# Patient Record
Sex: Female | Born: 1975 | Race: Black or African American | Hispanic: No | Marital: Married | Smoking: Never smoker
Health system: Southern US, Community
[De-identification: ages and names within clinical notes are randomized; demographics above are authoritative.]

## PROBLEM LIST (undated history)

## (undated) ENCOUNTER — Inpatient Hospital Stay (HOSPITAL_COMMUNITY): Payer: Self-pay

## (undated) DIAGNOSIS — L509 Urticaria, unspecified: Secondary | ICD-10-CM

## (undated) DIAGNOSIS — E669 Obesity, unspecified: Secondary | ICD-10-CM

## (undated) DIAGNOSIS — J069 Acute upper respiratory infection, unspecified: Secondary | ICD-10-CM

## (undated) DIAGNOSIS — I119 Hypertensive heart disease without heart failure: Secondary | ICD-10-CM

## (undated) DIAGNOSIS — J301 Allergic rhinitis due to pollen: Secondary | ICD-10-CM

## (undated) DIAGNOSIS — K219 Gastro-esophageal reflux disease without esophagitis: Secondary | ICD-10-CM

## (undated) DIAGNOSIS — I10 Essential (primary) hypertension: Secondary | ICD-10-CM

## (undated) DIAGNOSIS — D709 Neutropenia, unspecified: Secondary | ICD-10-CM

## (undated) HISTORY — DX: Hypertensive heart disease without heart failure: I11.9

## (undated) HISTORY — DX: Obesity, unspecified: E66.9

## (undated) HISTORY — DX: Allergic rhinitis due to pollen: J30.1

## (undated) HISTORY — DX: Urticaria, unspecified: L50.9

## (undated) HISTORY — PX: WISDOM TOOTH EXTRACTION: SHX21

## (undated) HISTORY — DX: Acute upper respiratory infection, unspecified: J06.9

## (undated) HISTORY — DX: Neutropenia, unspecified: D70.9

---

## 2005-09-08 ENCOUNTER — Other Ambulatory Visit: Admission: RE | Admit: 2005-09-08 | Discharge: 2005-09-08 | Payer: Self-pay | Admitting: Obstetrics and Gynecology

## 2006-11-27 ENCOUNTER — Emergency Department (HOSPITAL_COMMUNITY): Admission: EM | Admit: 2006-11-27 | Discharge: 2006-11-27 | Payer: Self-pay | Admitting: Emergency Medicine

## 2007-01-21 ENCOUNTER — Emergency Department (HOSPITAL_COMMUNITY): Admission: EM | Admit: 2007-01-21 | Discharge: 2007-01-21 | Payer: Self-pay | Admitting: Family Medicine

## 2008-01-12 ENCOUNTER — Emergency Department (HOSPITAL_COMMUNITY): Admission: EM | Admit: 2008-01-12 | Discharge: 2008-01-12 | Payer: Self-pay | Admitting: Family Medicine

## 2010-01-23 ENCOUNTER — Encounter: Admission: RE | Admit: 2010-01-23 | Discharge: 2010-01-23 | Payer: Self-pay | Admitting: Internal Medicine

## 2011-02-12 ENCOUNTER — Emergency Department (HOSPITAL_COMMUNITY)
Admission: EM | Admit: 2011-02-12 | Discharge: 2011-02-12 | Disposition: A | Discharge status: Home / Self Care | Payer: 59 | Attending: Emergency Medicine | Admitting: Emergency Medicine

## 2011-02-12 DIAGNOSIS — R1013 Epigastric pain: Secondary | ICD-10-CM | POA: Insufficient documentation

## 2011-02-12 DIAGNOSIS — I10 Essential (primary) hypertension: Secondary | ICD-10-CM | POA: Insufficient documentation

## 2011-02-12 LAB — URINALYSIS, ROUTINE W REFLEX MICROSCOPIC
Bilirubin Urine: NEGATIVE
Glucose, UA: NEGATIVE mg/dL
Hgb urine dipstick: NEGATIVE
Ketones, ur: NEGATIVE mg/dL
Nitrite: NEGATIVE
Protein, ur: NEGATIVE mg/dL
Specific Gravity, Urine: 1.011 (ref 1.005–1.030)
Urobilinogen, UA: 0.2 mg/dL (ref 0.0–1.0)
pH: 7 (ref 5.0–8.0)

## 2011-02-12 LAB — CBC
HCT: 41.3 % (ref 36.0–46.0)
Hemoglobin: 13.4 g/dL (ref 12.0–15.0)
MCHC: 32.4 g/dL (ref 30.0–36.0)
MCV: 78.4 fL (ref 78.0–100.0)
Platelets: 434 10*3/uL — ABNORMAL HIGH (ref 150–400)
RDW: 13 % (ref 11.5–15.5)

## 2011-02-12 LAB — DIFFERENTIAL
Basophils Relative: 0 % (ref 0–1)
Eosinophils Relative: 1 % (ref 0–5)
Neutrophils Relative %: 55 % (ref 43–77)

## 2011-02-12 LAB — COMPREHENSIVE METABOLIC PANEL
ALT: 15 U/L (ref 0–35)
AST: 20 U/L (ref 0–37)
Albumin: 4 g/dL (ref 3.5–5.2)
BUN: 7 mg/dL (ref 6–23)
Calcium: 9.2 mg/dL (ref 8.4–10.5)
Creatinine, Ser: 0.85 mg/dL (ref 0.4–1.2)
Total Protein: 7.6 g/dL (ref 6.0–8.3)

## 2011-02-12 LAB — PREGNANCY, URINE: Preg Test, Ur: NEGATIVE

## 2011-03-10 LAB — HEMOGLOBIN A1C: Hgb A1c MFr Bld: 6.1 % — AB (ref 4.0–6.0)

## 2011-03-10 LAB — LIPID PANEL
HDL: 47 mg/dL (ref 35–70)
LDL Cholesterol: 113 mg/dL
LDl/HDL Ratio: 3.8

## 2011-03-13 ENCOUNTER — Encounter: Payer: Self-pay | Admitting: Internal Medicine

## 2011-04-30 ENCOUNTER — Ambulatory Visit (HOSPITAL_COMMUNITY): Payer: 59

## 2011-05-01 ENCOUNTER — Ambulatory Visit (HOSPITAL_COMMUNITY): Payer: 59 | Attending: Internal Medicine

## 2011-06-17 ENCOUNTER — Ambulatory Visit (HOSPITAL_COMMUNITY)
Admission: RE | Admit: 2011-06-17 | Discharge: 2011-06-17 | Disposition: A | Discharge status: Home / Self Care | Payer: 59 | Source: Ambulatory Visit | Attending: Internal Medicine | Admitting: Internal Medicine

## 2011-06-17 DIAGNOSIS — I10 Essential (primary) hypertension: Secondary | ICD-10-CM | POA: Insufficient documentation

## 2011-06-17 DIAGNOSIS — I739 Peripheral vascular disease, unspecified: Secondary | ICD-10-CM

## 2011-06-17 DIAGNOSIS — R209 Unspecified disturbances of skin sensation: Secondary | ICD-10-CM | POA: Insufficient documentation

## 2011-08-26 ENCOUNTER — Ambulatory Visit (HOSPITAL_COMMUNITY)
Admission: RE | Admit: 2011-08-26 | Discharge: 2011-08-26 | Disposition: A | Discharge status: Home / Self Care | Payer: 59 | Source: Ambulatory Visit | Attending: Obstetrics and Gynecology | Admitting: Obstetrics and Gynecology

## 2011-08-26 LAB — ABO/RH: ABO/RH(D): O POS

## 2011-09-16 LAB — OB RESULTS CONSOLE RPR: RPR: NONREACTIVE

## 2011-09-16 LAB — OB RESULTS CONSOLE HIV ANTIBODY (ROUTINE TESTING): HIV: NONREACTIVE

## 2011-09-16 LAB — OB RESULTS CONSOLE HEPATITIS B SURFACE ANTIGEN: Hepatitis B Surface Ag: NEGATIVE

## 2011-09-16 LAB — OB RESULTS CONSOLE ANTIBODY SCREEN: Antibody Screen: NEGATIVE

## 2012-02-25 ENCOUNTER — Inpatient Hospital Stay (HOSPITAL_COMMUNITY)
Admission: AD | Admit: 2012-02-25 | Discharge: 2012-02-25 | Disposition: A | Discharge status: Home / Self Care | Payer: 59 | Source: Ambulatory Visit | Attending: Obstetrics and Gynecology | Admitting: Obstetrics and Gynecology

## 2012-02-25 ENCOUNTER — Encounter (HOSPITAL_COMMUNITY): Payer: Self-pay | Admitting: *Deleted

## 2012-02-25 DIAGNOSIS — O212 Late vomiting of pregnancy: Secondary | ICD-10-CM | POA: Insufficient documentation

## 2012-02-25 DIAGNOSIS — K529 Noninfective gastroenteritis and colitis, unspecified: Secondary | ICD-10-CM

## 2012-02-25 DIAGNOSIS — K5289 Other specified noninfective gastroenteritis and colitis: Secondary | ICD-10-CM | POA: Insufficient documentation

## 2012-02-25 DIAGNOSIS — O99891 Other specified diseases and conditions complicating pregnancy: Secondary | ICD-10-CM | POA: Insufficient documentation

## 2012-02-25 HISTORY — DX: Essential (primary) hypertension: I10

## 2012-02-25 LAB — URINALYSIS, ROUTINE W REFLEX MICROSCOPIC
Glucose, UA: NEGATIVE mg/dL
Hgb urine dipstick: NEGATIVE
Ketones, ur: NEGATIVE mg/dL
Nitrite: NEGATIVE
Specific Gravity, Urine: >1.03 — ABNORMAL HIGH (ref 1.005–1.030)
Urobilinogen, UA: 0.2 mg/dL (ref 0.0–1.0)

## 2012-02-25 LAB — URINE MICROSCOPIC-ADD ON

## 2012-02-25 MED ORDER — PROMETHAZINE HCL 25 MG PO TABS: 25.0000 mg | ORAL_TABLET | Freq: Four times a day (QID) | ORAL | Status: AC | PRN

## 2012-02-25 NOTE — MAU Note (Signed)
Pt states, " I stated having diarrhea at 3 pm, and then shortly afterwards I started vomiting. I 've thrown up once and had diarrhea 3 times."

## 2012-02-25 NOTE — Discharge Instructions (Signed)
Viral Gastroenteritis Viral gastroenteritis is also known as stomach flu. This condition affects the stomach and intestinal tract. It can cause sudden diarrhea and vomiting. The illness typically lasts 3 to 8 days. Most people develop an immune response that eventually gets rid of the virus. While this natural response develops, the virus can make you quite ill. CAUSES  Many different viruses can cause gastroenteritis, such as rotavirus or noroviruses. You can catch one of these viruses by consuming contaminated food or water. You may also catch a virus by sharing utensils or other personal items with an infected person or by touching a contaminated surface. SYMPTOMS  The most common symptoms are diarrhea and vomiting. These problems can cause a severe loss of body fluids (dehydration) and a body salt (electrolyte) imbalance. Other symptoms may include:  Fever.   Headache.   Fatigue.   Abdominal pain.  DIAGNOSIS  Your caregiver can usually diagnose viral gastroenteritis based on your symptoms and a physical exam. A stool sample may also be taken to test for the presence of viruses or other infections. TREATMENT  This illness typically goes away on its own. Treatments are aimed at rehydration. The most serious cases of viral gastroenteritis involve vomiting so severely that you are not able to keep fluids down. In these cases, fluids must be given through an intravenous line (IV). HOME CARE INSTRUCTIONS   Drink enough fluids to keep your urine clear or pale yellow. Drink small amounts of fluids frequently and increase the amounts as tolerated.   Ask your caregiver for specific rehydration instructions.   Avoid:   Foods high in sugar.   Alcohol.   Carbonated drinks.   Tobacco.   Juice.   Caffeine drinks.   Extremely hot or cold fluids.   Fatty, greasy foods.   Too much intake of anything at one time.   Dairy products until 24 to 48 hours after diarrhea stops.   You may  consume probiotics. Probiotics are active cultures of beneficial bacteria. They may lessen the amount and number of diarrheal stools in adults. Probiotics can be found in yogurt with active cultures and in supplements.   Wash your hands well to avoid spreading the virus.   Only take over-the-counter or prescription medicines for pain, discomfort, or fever as directed by your caregiver. Do not give aspirin to children. Antidiarrheal medicines are not recommended.   Ask your caregiver if you should continue to take your regular prescribed and over-the-counter medicines.   Keep all follow-up appointments as directed by your caregiver.  SEEK IMMEDIATE MEDICAL CARE IF:   You are unable to keep fluids down.   You do not urinate at least once every 6 to 8 hours.   You develop shortness of breath.   You notice blood in your stool or vomit. This may look like coffee grounds.   You have abdominal pain that increases or is concentrated in one small area (localized).   You have persistent vomiting or diarrhea.   You have a fever.   The patient is a child younger than 3 months, and he or she has a fever.   The patient is a child older than 3 months, and he or she has a fever and persistent symptoms.   The patient is a child older than 3 months, and he or she has a fever and symptoms suddenly get worse.   The patient is a baby, and he or she has no tears when crying.  MAKE SURE YOU:     Understand these instructions.   Will watch your condition.   Will get help right away if you are not doing well or get worse.  Document Released: 11/17/2005 Document Revised: 11/06/2011 Document Reviewed: 09/03/2011 [employer] Patient Information 2012 [employer]. 

## 2012-02-25 NOTE — MAU Provider Note (Signed)
History   Pt presents today c/o N&V x 2 episodes. She states her sx came on suddenly and she attributes her sx to something she ate. She states her nausea and vomiting has now resolved although she did have 2 episodes of diarrhea since being in the MAU. She reports GFM and denies vag dc, bleeding, fever, or any other sx.   CSN: [id number]  Arrival date and time: 02/25/12 4132   First Provider Initiated Contact with Patient 02/25/12 2029      Chief Complaint  Patient presents with  . Emesis  . Diarrhea  . Abdominal Pain   HPI  OB History    Grav Para Term Preterm Abortions TAB SAB Ect Mult Living   2 1  1      1       Past Medical History  Diagnosis Date  . 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 Allergic rhinitis due to pollen   . Neutropenia, unspecified   . Obesity, unspecified   . Hypertension     History reviewed. No pertinent past surgical history.  Family History  Problem Relation Age of Onset  . Hypertension Mother   . Hypertension Father   . Heart disease Father     History  Substance Use Topics  . Smoking status: Never Smoker   . Smokeless tobacco: Never Used  . Alcohol Use: No    Allergies: No Known Allergies  Prescriptions prior to admission  Medication Sig Dispense Refill  . labetalol (NORMODYNE) 300 MG tablet Take 300 mg by mouth 2 (two) times daily.      . methyldopa (ALDOMET) 250 MG tablet Take 250 mg by mouth 2 (two) times daily.      . Prenatal Vit-Fe Fumarate-FA (PRENATAL MULTIVITAMIN) TABS Take 1 tablet by mouth daily.        Review of Systems  Constitutional: Negative for fever and chills.  Eyes: Negative for blurred vision and double vision.  Respiratory: Negative for cough, hemoptysis, sputum production, shortness of breath and wheezing.   Cardiovascular: Negative for chest pain and palpitations.  Gastrointestinal: Positive for nausea, vomiting and diarrhea. Negative for abdominal pain and constipation.    Genitourinary: Negative for dysuria, urgency, frequency and hematuria.  Neurological: Negative for dizziness and headaches.  Psychiatric/Behavioral: Negative for depression and suicidal ideas.   Physical Exam   Blood pressure 148/94, pulse 103, temperature 97.7 F (36.5 C), temperature source Oral, resp. rate 20, height 5' 4.5" (1.638 m), weight 233 lb 6 oz (105.858 kg).  Physical Exam  Nursing note and vitals reviewed. Constitutional: She is oriented to person, place, and time. She appears well-developed and well-nourished. No distress.  HENT:  Head: Normocephalic and atraumatic.  Eyes: EOM are normal. Pupils are equal, round, and reactive to light.  GI: Soft. She exhibits no distension. There is no tenderness. There is no rebound and no guarding.  Neurological: She is alert and oriented to person, place, and time.  Skin: Skin is warm and dry. She is not diaphoretic.  Psychiatric: She has a normal mood and affect. Her behavior is normal. Judgment and thought content normal.    MAU Course  Procedures  Discussed pt with Dr. Vincente Poli. She states her BP is improved since she recently adjusted her BP medication. She thinks it is ok to dc pt to home with antiemetics and precautions.  Assessment and Plan  Gastroenteritis: discussed with pt at length. Will dc to home with phenergan. She has a f/u appt scheduled. She  may use Imodium for diarrhea. Discussed diet, activity, risks, and precautions.  Clinton Gallant. Aishi Courts III, DrHSc, MPAS, PA-C  02/25/2012, 8:35 PM

## 2012-02-25 NOTE — MAU Note (Signed)
Pt states the nausea has passed now but has had 2 loose stools since she got here-states she thinks it was something she ate-note pt has a hx of chronic HTN

## 2012-03-25 ENCOUNTER — Encounter (HOSPITAL_COMMUNITY): Payer: Self-pay | Admitting: Obstetrics

## 2012-03-25 ENCOUNTER — Inpatient Hospital Stay (HOSPITAL_COMMUNITY)
Admission: AD | Admit: 2012-03-25 | Discharge: 2012-03-29 | DRG: 766 | Disposition: A | Discharge status: Home / Self Care | Payer: 59 | Source: Ambulatory Visit | Attending: Obstetrics and Gynecology | Admitting: Obstetrics and Gynecology

## 2012-03-25 DIAGNOSIS — Z98891 History of uterine scar from previous surgery: Secondary | ICD-10-CM

## 2012-03-25 DIAGNOSIS — IMO0002 Reserved for concepts with insufficient information to code with codable children: Principal | ICD-10-CM | POA: Diagnosis present

## 2012-03-25 DIAGNOSIS — I10 Essential (primary) hypertension: Secondary | ICD-10-CM | POA: Diagnosis present

## 2012-03-25 LAB — COMPREHENSIVE METABOLIC PANEL
ALT: 12 U/L (ref 0–35)
AST: 18 U/L (ref 0–37)
Alkaline Phosphatase: 115 U/L (ref 39–117)
CO2: 19 mEq/L (ref 19–32)
Calcium: 9.3 mg/dL (ref 8.4–10.5)
GFR calc non Af Amer: >90 mL/min (ref >90)
Potassium: 4.1 mEq/L (ref 3.5–5.1)
Sodium: 134 mEq/L — ABNORMAL LOW (ref 135–145)

## 2012-03-25 LAB — CBC
MCH: 24.8 pg — ABNORMAL LOW (ref 26.0–34.0)
Platelets: 324 10*3/uL (ref 150–400)
RBC: 4.32 MIL/uL (ref 3.87–5.11)
WBC: 7.9 10*3/uL (ref 4.0–10.5)

## 2012-03-25 MED ORDER — DIPHENHYDRAMINE HCL 50 MG/ML IJ SOLN: 12.5000 mg | INTRAMUSCULAR | Status: DC | PRN

## 2012-03-25 MED ORDER — LACTATED RINGERS IV SOLN
INTRAVENOUS | Status: DC
  Administered 2012-03-25: 20:00:00 via INTRAVENOUS

## 2012-03-25 MED ORDER — CITRIC ACID-SODIUM CITRATE 334-500 MG/5ML PO SOLN
30.0000 mL | ORAL | Status: DC | PRN
  Administered 2012-03-26: 30 mL via ORAL
  Filled 2012-03-25 (×2): qty 15

## 2012-03-25 MED ORDER — LACTATED RINGERS IV SOLN: 500.0000 mL | Freq: Once | INTRAVENOUS | Status: DC

## 2012-03-25 MED ORDER — OXYCODONE-ACETAMINOPHEN 5-325 MG PO TABS: 1.0000 | ORAL_TABLET | ORAL | Status: DC | PRN

## 2012-03-25 MED ORDER — PHENYLEPHRINE 40 MCG/ML (10ML) SYRINGE FOR IV PUSH (FOR BLOOD PRESSURE SUPPORT): 80.0000 ug | PREFILLED_SYRINGE | INTRAVENOUS | Status: DC | PRN

## 2012-03-25 MED ORDER — LACTATED RINGERS IV SOLN: 500.0000 mL | INTRAVENOUS | Status: DC | PRN

## 2012-03-25 MED ORDER — BUTORPHANOL TARTRATE 2 MG/ML IJ SOLN: 1.0000 mg | INTRAMUSCULAR | Status: DC | PRN

## 2012-03-25 MED ORDER — OXYTOCIN 20 UNITS IN LACTATED RINGERS INFUSION - SIMPLE: 125.0000 mL/h | Freq: Once | INTRAVENOUS | Status: DC

## 2012-03-25 MED ORDER — OXYTOCIN BOLUS FROM INFUSION
500.0000 mL | Freq: Once | INTRAVENOUS | Status: DC
  Filled 2012-03-25: qty 500

## 2012-03-25 MED ORDER — MAGNESIUM SULFATE BOLUS VIA INFUSION
4.0000 g | Freq: Once | INTRAVENOUS | Status: AC
  Administered 2012-03-25: 4 g via INTRAVENOUS
  Filled 2012-03-25: qty 500

## 2012-03-25 MED ORDER — EPHEDRINE 5 MG/ML INJ: 10.0000 mg | INTRAVENOUS | Status: DC | PRN

## 2012-03-25 MED ORDER — FENTANYL 2.5 MCG/ML BUPIVACAINE 1/10 % EPIDURAL INFUSION (WH - ANES): 14.0000 mL/h | INTRAMUSCULAR | Status: DC

## 2012-03-25 MED ORDER — LABETALOL HCL 5 MG/ML IV SOLN
20.0000 mg | Freq: Once | INTRAVENOUS | Status: AC
  Administered 2012-03-25: 20 mg via INTRAVENOUS
  Filled 2012-03-25: qty 4

## 2012-03-25 MED ORDER — ONDANSETRON HCL 4 MG/2ML IJ SOLN: 4.0000 mg | Freq: Four times a day (QID) | INTRAMUSCULAR | Status: DC | PRN

## 2012-03-25 MED ORDER — METHYLDOPA 250 MG PO TABS
250.0000 mg | ORAL_TABLET | Freq: Two times a day (BID) | ORAL | Status: DC
  Administered 2012-03-25 – 2012-03-29 (×6): 250 mg via ORAL
  Filled 2012-03-25 (×8): qty 1

## 2012-03-25 MED ORDER — LABETALOL HCL 5 MG/ML IV SOLN
10.0000 mg | Freq: Once | INTRAVENOUS | Status: AC
  Administered 2012-03-25: 10 mg via INTRAVENOUS

## 2012-03-25 MED ORDER — LABETALOL HCL 5 MG/ML IV SOLN
5.0000 mg | Freq: Once | INTRAVENOUS | Status: AC
  Administered 2012-03-25: 5 mg via INTRAVENOUS
  Filled 2012-03-25: qty 4

## 2012-03-25 MED ORDER — IBUPROFEN 600 MG PO TABS: 600.0000 mg | ORAL_TABLET | Freq: Four times a day (QID) | ORAL | Status: DC | PRN

## 2012-03-25 MED ORDER — LABETALOL HCL 300 MG PO TABS
300.0000 mg | ORAL_TABLET | Freq: Three times a day (TID) | ORAL | Status: DC
  Administered 2012-03-25 – 2012-03-26 (×2): 300 mg via ORAL
  Filled 2012-03-25 (×5): qty 1

## 2012-03-25 MED ORDER — ACETAMINOPHEN 325 MG PO TABS: 650.0000 mg | ORAL_TABLET | ORAL | Status: DC | PRN

## 2012-03-25 MED ORDER — LIDOCAINE HCL (PF) 1 % IJ SOLN: 30.0000 mL | INTRAMUSCULAR | Status: DC | PRN

## 2012-03-25 MED ORDER — FLEET ENEMA 7-19 GM/118ML RE ENEM: 1.0000 | ENEMA | RECTAL | Status: DC | PRN

## 2012-03-25 MED ORDER — MAGNESIUM SULFATE 40 G IN LACTATED RINGERS - SIMPLE
2.0000 g/h | INTRAVENOUS | Status: DC
  Filled 2012-03-25: qty 500

## 2012-03-25 NOTE — H&P (Signed)
Jeanette Brown is a 36 y.o. female presenting for elevated BPs.  Patient on labetalol and aldomet and at rest for several weeks.  BPs increasing. Diastolic at home last pm was 120. Today in office BP 168/110. Denies HA/vision change/epigastric pain. Maternal Medical History:  Fetal activity: Perceived fetal activity is normal.      OB History    Grav Para Term Preterm Abortions TAB SAB Ect Mult Living   2 1  1      1      Past Medical History  Diagnosis Date  . 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 Allergic rhinitis due to pollen   . Neutropenia, unspecified   . Obesity, unspecified   . Hypertension    No past surgical history on file. Family History: family history includes Heart disease in her father and Hypertension in her father and mother. Social History:  reports that she has never smoked. She has never used smokeless tobacco. She reports that she does not drink alcohol or use illicit drugs.  Review of Systems  Eyes: Negative for blurred vision.  Respiratory:       C/O sinus drainage and sore throat  Gastrointestinal: Negative for abdominal pain.    Dilation: 2 Effacement (%): 60 Station: -2 Exam by:: Brien Lowe Blood pressure 189/120, pulse 104, resp. rate 20.   Fetal Exam Fetal Monitor Review: Pattern: accelerations present.       Physical Exam  HENT:       Moderate facial edema and periorbital edema  Cardiovascular: Normal rate and regular rhythm.   Respiratory: Effort normal and breath sounds normal.  GI: Soft. There is no tenderness.  Neurological: She has normal reflexes.    Prenatal labs: ABO, Rh: --/--/O POS (09/25 4098) Antibody:   Rubella:   RPR:    HBsAg:    HIV:    GBS:     Assessment/Plan: 36 yo G2P1 at 65 1/7 weeks, chronic HTN, increased BPs C/W severe preeclampsia. D/W pt/husband above. Patient has not taken evening meds yet. Will give pm dose of labetalol and aldomet, begin magnesium sulfate. After Bps under  better control and magnesium running, will probably begin two stage induction.  Risks D/W patient. Will check labs.   Jeanette Brown,Jeanette Brown 03/25/2012, 8:42 PM

## 2012-03-26 ENCOUNTER — Encounter (HOSPITAL_COMMUNITY)
Admission: AD | Disposition: A | Discharge status: Home / Self Care | Payer: Self-pay | Source: Ambulatory Visit | Attending: Obstetrics and Gynecology

## 2012-03-26 ENCOUNTER — Inpatient Hospital Stay (HOSPITAL_COMMUNITY): Payer: 59 | Admitting: Anesthesiology

## 2012-03-26 ENCOUNTER — Encounter (HOSPITAL_COMMUNITY): Payer: Self-pay | Admitting: Anesthesiology

## 2012-03-26 ENCOUNTER — Encounter (HOSPITAL_COMMUNITY): Payer: Self-pay | Admitting: Obstetrics

## 2012-03-26 DIAGNOSIS — Z98891 History of uterine scar from previous surgery: Secondary | ICD-10-CM

## 2012-03-26 DIAGNOSIS — IMO0002 Reserved for concepts with insufficient information to code with codable children: Secondary | ICD-10-CM

## 2012-03-26 DIAGNOSIS — O1410 Severe pre-eclampsia, unspecified trimester: Secondary | ICD-10-CM

## 2012-03-26 DIAGNOSIS — I10 Essential (primary) hypertension: Secondary | ICD-10-CM

## 2012-03-26 LAB — CBC
MCH: 24.8 pg — ABNORMAL LOW (ref 26.0–34.0)
MCV: 77.3 fL — ABNORMAL LOW (ref 78.0–100.0)
Platelets: 291 10*3/uL (ref 150–400)
RDW: 15.1 % (ref 11.5–15.5)

## 2012-03-26 LAB — COMPREHENSIVE METABOLIC PANEL
AST: 14 U/L (ref 0–37)
Albumin: 2.1 g/dL — ABNORMAL LOW (ref 3.5–5.2)
Calcium: 8.3 mg/dL — ABNORMAL LOW (ref 8.4–10.5)
Creatinine, Ser: 0.75 mg/dL (ref 0.50–1.10)
GFR calc non Af Amer: >90 mL/min (ref >90)

## 2012-03-26 SURGERY — Surgical Case
Anesthesia: Spinal | Wound class: Clean Contaminated

## 2012-03-26 MED ORDER — FUROSEMIDE 10 MG/ML IJ SOLN
20.0000 mg | Freq: Once | INTRAMUSCULAR | Status: AC
  Administered 2012-03-26: 20 mg via INTRAVENOUS
  Filled 2012-03-26: qty 2

## 2012-03-26 MED ORDER — ONDANSETRON HCL 4 MG PO TABS: 4.0000 mg | ORAL_TABLET | ORAL | Status: DC | PRN

## 2012-03-26 MED ORDER — PROMETHAZINE HCL 25 MG/ML IJ SOLN: 6.2500 mg | INTRAMUSCULAR | Status: DC | PRN

## 2012-03-26 MED ORDER — PRENATAL MULTIVITAMIN CH
1.0000 | ORAL_TABLET | Freq: Every day | ORAL | Status: DC
  Administered 2012-03-27 – 2012-03-29 (×3): 1 via ORAL
  Filled 2012-03-26 (×3): qty 1

## 2012-03-26 MED ORDER — MENTHOL 3 MG MT LOZG: 1.0000 | LOZENGE | OROMUCOSAL | Status: DC | PRN

## 2012-03-26 MED ORDER — DIPHENHYDRAMINE HCL 50 MG/ML IJ SOLN: 12.5000 mg | INTRAMUSCULAR | Status: DC | PRN

## 2012-03-26 MED ORDER — EPHEDRINE 5 MG/ML INJ
INTRAVENOUS | Status: AC
  Filled 2012-03-26: qty 10

## 2012-03-26 MED ORDER — OXYTOCIN 20 UNITS IN LACTATED RINGERS INFUSION - SIMPLE
125.0000 mL/h | INTRAVENOUS | Status: AC
  Administered 2012-03-26: 125 mL/h via INTRAVENOUS
  Filled 2012-03-26: qty 1000

## 2012-03-26 MED ORDER — KETOROLAC TROMETHAMINE 30 MG/ML IJ SOLN: 15.0000 mg | Freq: Once | INTRAMUSCULAR | Status: DC | PRN

## 2012-03-26 MED ORDER — METOCLOPRAMIDE HCL 5 MG/ML IJ SOLN: 10.0000 mg | Freq: Three times a day (TID) | INTRAMUSCULAR | Status: DC | PRN

## 2012-03-26 MED ORDER — LACTATED RINGERS IV SOLN
INTRAVENOUS | Status: DC
  Administered 2012-03-26 – 2012-03-27 (×2): via INTRAVENOUS

## 2012-03-26 MED ORDER — HYDROMORPHONE HCL PF 1 MG/ML IJ SOLN
0.2500 mg | INTRAMUSCULAR | Status: DC | PRN
  Administered 2012-03-26: 0.5 mg via INTRAVENOUS

## 2012-03-26 MED ORDER — SIMETHICONE 80 MG PO CHEW: 80.0000 mg | CHEWABLE_TABLET | ORAL | Status: DC | PRN

## 2012-03-26 MED ORDER — OXYCODONE-ACETAMINOPHEN 5-325 MG PO TABS
1.0000 | ORAL_TABLET | ORAL | Status: DC | PRN
  Administered 2012-03-27: 1 via ORAL
  Filled 2012-03-26: qty 1

## 2012-03-26 MED ORDER — ONDANSETRON HCL 4 MG/2ML IJ SOLN
4.0000 mg | INTRAMUSCULAR | Status: DC | PRN
  Administered 2012-03-26: 4 mg via INTRAVENOUS
  Filled 2012-03-26: qty 2

## 2012-03-26 MED ORDER — NALBUPHINE HCL 10 MG/ML IJ SOLN
5.0000 mg | INTRAMUSCULAR | Status: DC | PRN
  Filled 2012-03-26: qty 1

## 2012-03-26 MED ORDER — CEFAZOLIN SODIUM 1-5 GM-% IV SOLN
INTRAVENOUS | Status: DC | PRN
  Administered 2012-03-26: 1 g via INTRAVENOUS

## 2012-03-26 MED ORDER — FENTANYL CITRATE 0.05 MG/ML IJ SOLN
INTRAMUSCULAR | Status: AC
  Filled 2012-03-26: qty 2

## 2012-03-26 MED ORDER — FENTANYL CITRATE 0.05 MG/ML IJ SOLN
INTRAMUSCULAR | Status: DC | PRN
  Administered 2012-03-26: 25 ug via INTRATHECAL

## 2012-03-26 MED ORDER — SENNOSIDES-DOCUSATE SODIUM 8.6-50 MG PO TABS
2.0000 | ORAL_TABLET | Freq: Every day | ORAL | Status: DC
  Administered 2012-03-26 – 2012-03-28 (×3): 2 via ORAL

## 2012-03-26 MED ORDER — DIPHENHYDRAMINE HCL 25 MG PO CAPS
25.0000 mg | ORAL_CAPSULE | ORAL | Status: DC | PRN
  Administered 2012-03-26: 25 mg via ORAL
  Filled 2012-03-26: qty 1

## 2012-03-26 MED ORDER — TERBUTALINE SULFATE 1 MG/ML IJ SOLN
0.2500 mg | Freq: Once | INTRAMUSCULAR | Status: AC | PRN
  Administered 2012-03-26: 0.25 mg via SUBCUTANEOUS
  Filled 2012-03-26: qty 1

## 2012-03-26 MED ORDER — DIPHENHYDRAMINE HCL 50 MG/ML IJ SOLN: 25.0000 mg | INTRAMUSCULAR | Status: DC | PRN

## 2012-03-26 MED ORDER — OXYTOCIN 10 UNIT/ML IJ SOLN
INTRAMUSCULAR | Status: AC
  Filled 2012-03-26: qty 4

## 2012-03-26 MED ORDER — CEFAZOLIN SODIUM 1-5 GM-% IV SOLN
INTRAVENOUS | Status: AC
  Filled 2012-03-26: qty 50

## 2012-03-26 MED ORDER — DIBUCAINE 1 % RE OINT: 1.0000 "application " | TOPICAL_OINTMENT | RECTAL | Status: DC | PRN

## 2012-03-26 MED ORDER — MORPHINE SULFATE (PF) 0.5 MG/ML IJ SOLN
INTRAMUSCULAR | Status: DC | PRN
  Administered 2012-03-26: .2 mg via INTRATHECAL

## 2012-03-26 MED ORDER — DIPHENHYDRAMINE HCL 25 MG PO CAPS: 25.0000 mg | ORAL_CAPSULE | Freq: Four times a day (QID) | ORAL | Status: DC | PRN

## 2012-03-26 MED ORDER — ONDANSETRON HCL 4 MG/2ML IJ SOLN
INTRAMUSCULAR | Status: AC
  Filled 2012-03-26: qty 2

## 2012-03-26 MED ORDER — MISOPROSTOL 25 MCG QUARTER TABLET
25.0000 ug | ORAL_TABLET | ORAL | Status: DC | PRN
  Administered 2012-03-26: 25 ug via VAGINAL
  Filled 2012-03-26: qty 0.25

## 2012-03-26 MED ORDER — KETOROLAC TROMETHAMINE 30 MG/ML IJ SOLN: 30.0000 mg | Freq: Four times a day (QID) | INTRAMUSCULAR | Status: AC | PRN

## 2012-03-26 MED ORDER — MEPERIDINE HCL 25 MG/ML IJ SOLN: 6.2500 mg | INTRAMUSCULAR | Status: DC | PRN

## 2012-03-26 MED ORDER — KETOROLAC TROMETHAMINE 60 MG/2ML IM SOLN
60.0000 mg | Freq: Once | INTRAMUSCULAR | Status: AC | PRN
  Administered 2012-03-26: 60 mg via INTRAMUSCULAR

## 2012-03-26 MED ORDER — KETOROLAC TROMETHAMINE 60 MG/2ML IM SOLN
INTRAMUSCULAR | Status: AC
  Filled 2012-03-26: qty 2

## 2012-03-26 MED ORDER — BUPIVACAINE IN DEXTROSE 0.75-8.25 % IT SOLN
INTRATHECAL | Status: DC | PRN
  Administered 2012-03-26: 1.6 mg via INTRATHECAL

## 2012-03-26 MED ORDER — SCOPOLAMINE 1 MG/3DAYS TD PT72
1.0000 | MEDICATED_PATCH | Freq: Once | TRANSDERMAL | Status: AC
  Administered 2012-03-26: 1.5 mg via TRANSDERMAL

## 2012-03-26 MED ORDER — ZOLPIDEM TARTRATE 5 MG PO TABS
5.0000 mg | ORAL_TABLET | Freq: Every evening | ORAL | Status: DC | PRN
  Administered 2012-03-26: 5 mg via ORAL
  Filled 2012-03-26: qty 1

## 2012-03-26 MED ORDER — ONDANSETRON HCL 4 MG/2ML IJ SOLN
INTRAMUSCULAR | Status: DC | PRN
  Administered 2012-03-26: 4 mg via INTRAVENOUS

## 2012-03-26 MED ORDER — MORPHINE SULFATE 0.5 MG/ML IJ SOLN
INTRAMUSCULAR | Status: AC
  Filled 2012-03-26: qty 10

## 2012-03-26 MED ORDER — MEASLES, MUMPS & RUBELLA VAC ~~LOC~~ INJ
0.5000 mL | INJECTION | Freq: Once | SUBCUTANEOUS | Status: AC
  Administered 2012-03-26: 0.5 mL via SUBCUTANEOUS
  Filled 2012-03-26: qty 0.5

## 2012-03-26 MED ORDER — IBUPROFEN 600 MG PO TABS
600.0000 mg | ORAL_TABLET | Freq: Four times a day (QID) | ORAL | Status: DC
  Administered 2012-03-26 – 2012-03-29 (×12): 600 mg via ORAL
  Filled 2012-03-26 (×12): qty 1

## 2012-03-26 MED ORDER — ONDANSETRON HCL 4 MG/2ML IJ SOLN: 4.0000 mg | Freq: Three times a day (TID) | INTRAMUSCULAR | Status: DC | PRN

## 2012-03-26 MED ORDER — EPHEDRINE SULFATE 50 MG/ML IJ SOLN
INTRAMUSCULAR | Status: DC | PRN
  Administered 2012-03-26: 15 mg via INTRAVENOUS

## 2012-03-26 MED ORDER — SIMETHICONE 80 MG PO CHEW
80.0000 mg | CHEWABLE_TABLET | Freq: Three times a day (TID) | ORAL | Status: DC
  Administered 2012-03-26 – 2012-03-29 (×10): 80 mg via ORAL

## 2012-03-26 MED ORDER — OXYTOCIN 20 UNITS IN LACTATED RINGERS INFUSION - SIMPLE
INTRAVENOUS | Status: DC | PRN
  Administered 2012-03-26: 20 [IU] via INTRAVENOUS

## 2012-03-26 MED ORDER — MAGNESIUM SULFATE 40 G IN LACTATED RINGERS - SIMPLE
2.0000 g/h | INTRAVENOUS | Status: DC
  Administered 2012-03-26 (×2): 2 g/h via INTRAVENOUS
  Filled 2012-03-26 (×2): qty 500

## 2012-03-26 MED ORDER — TETANUS-DIPHTH-ACELL PERTUSSIS 5-2.5-18.5 LF-MCG/0.5 IM SUSP
0.5000 mL | Freq: Once | INTRAMUSCULAR | Status: DC
  Filled 2012-03-26: qty 0.5

## 2012-03-26 MED ORDER — HYDROMORPHONE HCL PF 1 MG/ML IJ SOLN
INTRAMUSCULAR | Status: AC
  Filled 2012-03-26: qty 1

## 2012-03-26 MED ORDER — LANOLIN HYDROUS EX OINT: 1.0000 "application " | TOPICAL_OINTMENT | CUTANEOUS | Status: DC | PRN

## 2012-03-26 MED ORDER — SCOPOLAMINE 1 MG/3DAYS TD PT72
MEDICATED_PATCH | TRANSDERMAL | Status: AC
  Filled 2012-03-26: qty 1

## 2012-03-26 MED ORDER — LACTATED RINGERS IV SOLN
INTRAVENOUS | Status: DC | PRN
  Administered 2012-03-26 (×2): via INTRAVENOUS

## 2012-03-26 MED ORDER — WITCH HAZEL-GLYCERIN EX PADS: 1.0000 "application " | MEDICATED_PAD | CUTANEOUS | Status: DC | PRN

## 2012-03-26 MED ORDER — SODIUM CHLORIDE 0.9 % IV SOLN
1.0000 ug/kg/h | INTRAVENOUS | Status: DC | PRN
  Filled 2012-03-26: qty 2.5

## 2012-03-26 SURGICAL SUPPLY — 25 items
CLOTH BEACON ORANGE TIMEOUT ST (SAFETY) ×2 IMPLANT
CONTAINER PREFILL 10% NBF 15ML (MISCELLANEOUS) IMPLANT
DRESSING TELFA 8X3 (GAUZE/BANDAGES/DRESSINGS) ×1 IMPLANT
ELECT REM PT RETURN 9FT ADLT (ELECTROSURGICAL) ×2
ELECTRODE REM PT RTRN 9FT ADLT (ELECTROSURGICAL) ×1 IMPLANT
EXTRACTOR VACUUM M CUP 4 TUBE (SUCTIONS) IMPLANT
GAUZE SPONGE 4X4 12PLY STRL LF (GAUZE/BANDAGES/DRESSINGS) ×4 IMPLANT
GLOVE BIO SURGEON STRL SZ8 (GLOVE) ×4 IMPLANT
GOWN PREVENTION PLUS LG XLONG (DISPOSABLE) ×2 IMPLANT
KIT ABG SYR 3ML LUER SLIP (SYRINGE) ×2 IMPLANT
NDL HYPO 25X5/8 SAFETYGLIDE (NEEDLE) ×1 IMPLANT
NEEDLE HYPO 25X5/8 SAFETYGLIDE (NEEDLE) ×2 IMPLANT
NS IRRIG 1000ML POUR BTL (IV SOLUTION) ×2 IMPLANT
PACK C SECTION WH (CUSTOM PROCEDURE TRAY) ×2 IMPLANT
PAD ABD 7.5X8 STRL (GAUZE/BANDAGES/DRESSINGS) ×1 IMPLANT
SLEEVE SCD COMPRESS KNEE MED (MISCELLANEOUS) ×1 IMPLANT
STAPLER VISISTAT 35W (STAPLE) IMPLANT
STRIP CLOSURE SKIN 1/2X4 (GAUZE/BANDAGES/DRESSINGS) ×2 IMPLANT
SUT MNCRL 0 VIOLET CTX 36 (SUTURE) ×4 IMPLANT
SUT MONOCRYL 0 CTX 36 (SUTURE) ×4
SUT PDS AB 0 CTX 60 (SUTURE) ×2 IMPLANT
SUT PLAIN 0 NONE (SUTURE) IMPLANT
TOWEL OR 17X24 6PK STRL BLUE (TOWEL DISPOSABLE) ×4 IMPLANT
TRAY FOLEY CATH 14FR (SET/KITS/TRAYS/PACK) ×2 IMPLANT
WATER STERILE IRR 1000ML POUR (IV SOLUTION) ×2 IMPLANT

## 2012-03-26 NOTE — Anesthesia Postprocedure Evaluation (Signed)
Anesthesia Post Note  Patient: Jeanette Brown  Procedure(s) Performed: Procedure(s) (LRB): CESAREAN SECTION (N/A)  Anesthesia type: Spinal  Patient location: PACU  Post pain: Pain level controlled  Post assessment: Post-op Vital signs reviewed  Last Vitals:  Filed Vitals:   03/26/12 0600  BP: 132/92  Pulse:   Temp:   Resp: 22    Post vital signs: Reviewed  Level of consciousness: awake  Complications: No apparent anesthesia complications

## 2012-03-26 NOTE — Addendum Note (Signed)
Addendum  created 03/26/12 0818 by Bradon Fester S Sissi Padia, CRNA   Modules edited:Notes Section    

## 2012-03-26 NOTE — Anesthesia Postprocedure Evaluation (Signed)
  Anesthesia Post-op Note  Patient: Jeanette Brown  Procedure(s) Performed: Procedure(s) (LRB): CESAREAN SECTION (N/A)  Patient Location: PACU and Women's Unit  Anesthesia Type: Spinal  Level of Consciousness: awake, alert  and oriented  Airway and Oxygen Therapy: Patient Spontanous Breathing  Post-op Pain: mild  Post-op Assessment: Patient's Cardiovascular Status Stable, Respiratory Function Stable, No signs of Nausea or vomiting and Pain level controlled  Post-op Vital Signs: stable  Complications: No apparent anesthesia complications

## 2012-03-26 NOTE — Progress Notes (Signed)
Subjective: Postpartum Day 0: Cesarean Delivery Patient reports incisional pain and tolerating PO.    Objective: Vital signs in last 24 hours: Temp:  [97.3 F (36.3 C)-98.1 F (36.7 C)] 97.3 F (36.3 C) (04/26 0549) Pulse Rate:  [77-112] 85  (04/26 0900) Resp:  [16-28] 16  (04/26 0900) BP: (113-189)/(76-120) 119/81 mmHg (04/26 0900) SpO2:  [96 %-100 %] 97 % (04/26 0900) Weight:  [107.502 kg (237 lb)-109.997 kg (242 lb 8 oz)] 109.997 kg (242 lb 8 oz) (04/26 0549)  Physical Exam:  General: alert, cooperative and appears stated age Lochia: appropriate Uterine Fundus: firm Incision: no significant drainage DVT Evaluation: No evidence of DVT seen on physical exam.   Basename 03/25/12 2010  HGB 10.7*  HCT 33.3*    Assessment/Plan: Status post Cesarean section. Doing well postoperatively.  Continue current care Continue Magnesium Check CBC, CMET. Lasix. Consented for circ   Jeanette Brown L 03/26/2012, 9:38 AM

## 2012-03-26 NOTE — Brief Op Note (Signed)
03/25/2012 - 03/26/2012  3:00 AM  PATIENT:  Jeanette Brown  36 y.o. female  PRE-OPERATIVE DIAGNOSIS:  Nonreassuring Fetal Heart Rate, severe preeclampsia  POST-OPERATIVE DIAGNOSIS:  Nonreassuring Fetal Heart Rate, severe preeclampsia  PROCEDURE:  Procedure(s) (LRB): Low Transverse CESAREAN SECTION (N/A)  SURGEON:  Surgeon(s) and Role:    * Leslie Andrea, MD - Primary  PHYSICIAN ASSISTANT:   ASSISTANTS: Jaynie Collins   ANESTHESIA:   spinal  EBL:  Total I/O In: 2449.6 [P.O.:750; I.V.:1699.6] Out: 1400 [Urine:900; Blood:500]  BLOOD ADMINISTERED:none  DRAINS: Urinary Catheter (Foley)   LOCAL MEDICATIONS USED:  NONE  SPECIMEN:  Source of Specimen:  placenta  DISPOSITION OF SPECIMEN:  PATHOLOGY  COUNTS:  yes  TOURNIQUET:  * No tourniquets in log *  DICTATION: .Other Dictation: Dictation Number [id number]  PLAN OF CARE: Admit to inpatient   PATIENT DISPOSITION:  PACU - hemodynamically stable.   Delay start of Pharmacological VTE agent (>24hrs) due to surgical blood loss or risk of bleeding: not applicable

## 2012-03-26 NOTE — Anesthesia Procedure Notes (Signed)
Spinal  Patient location during procedure: OR Start time: 03/26/2012 2:24 AM End time: 03/26/2012 2:27 AM Staffing Anesthesiologist: Sandrea Hughs Preanesthetic Checklist Completed: patient identified, site marked, surgical consent, pre-op evaluation, timeout performed, IV checked, risks and benefits discussed and monitors and equipment checked Spinal Block Patient position: sitting Prep: DuraPrep Patient monitoring: heart rate, cardiac monitor, continuous pulse ox and blood pressure Approach: midline Location: L3-4 Injection technique: single-shot Needle Needle type: Sprotte  Needle gauge: 24 G Needle length: 9 cm Needle insertion depth: 7 cm Assessment Sensory level: T4

## 2012-03-26 NOTE — Anesthesia Preprocedure Evaluation (Signed)
Anesthesia Evaluation  Patient identified by MRN, date of birth, ID band Patient awake    Reviewed: Allergy & Precautions, H&P , NPO status , Patient's Chart, lab work & pertinent test results  Airway Mallampati: III TM Distance: >3 FB Neck ROM: full    Dental No notable dental hx.    Pulmonary neg pulmonary ROS,    Pulmonary exam normal       Cardiovascular hypertension,     Neuro/Psych negative neurological ROS  negative psych ROS   GI/Hepatic negative GI ROS, Neg liver ROS,   Endo/Other  negative endocrine ROSMorbid obesity  Renal/GU negative Renal ROS  negative genitourinary   Musculoskeletal negative musculoskeletal ROS (+)   Abdominal (+) + obese,   Peds negative pediatric ROS (+)  Hematology negative hematology ROS (+)   Anesthesia Other Findings   Reproductive/Obstetrics (+) Pregnancy                           Anesthesia Physical Anesthesia Plan  ASA: III and Emergent  Anesthesia Plan: Spinal   Post-op Pain Management:    Induction:   Airway Management Planned:   Additional Equipment:   Intra-op Plan:   Post-operative Plan:   Informed Consent: I have reviewed the patients History and Physical, chart, labs and discussed the procedure including the risks, benefits and alternatives for the proposed anesthesia with the patient or authorized representative who has indicated his/her understanding and acceptance.     Plan Discussed with: CRNA and Surgeon  Anesthesia Plan Comments:         Anesthesia Quick Evaluation

## 2012-03-26 NOTE — Transfer of Care (Signed)
Immediate Anesthesia Transfer of Care Note  Patient: Jeanette Brown  Procedure(s) Performed: Procedure(s) (LRB): CESAREAN SECTION (N/A)  Patient Location: PACU  Anesthesia Type: Spinal  Level of Consciousness: awake  Airway & Oxygen Therapy: Patient Spontanous Breathing  Post-op Assessment: Report given to PACU RN and Post -op Vital signs reviewed and stable  Post vital signs: stable  Complications: No apparent anesthesia complications

## 2012-03-26 NOTE — Progress Notes (Signed)
UR chart review completed.  

## 2012-03-26 NOTE — Op Note (Signed)
NAME:  Jeanette Brown, Jeanette Brown NO.:  [id number]  MEDICAL RECORD NO.:  [id number]  LOCATION:  [facility]                          FACILITY:  [facility]  PHYSICIAN:  Guy Sandifer. Henderson Cloud, M.D. DATE OF BIRTH:  07/14/1976  DATE OF PROCEDURE:  03/26/2012 DATE OF DISCHARGE:                              OPERATIVE REPORT   PREOPERATIVE DIAGNOSES: 1. Nonreassuring fetal heart tracing. 2. Severe preeclampsia.  POSTOPERATIVE DIAGNOSES: 1. Nonreassuring fetal heart tracing. 2. Severe preeclampsia.  PROCEDURE:  Low-transverse cesarean section.  SURGEON:  Harold Hedge, M.D.  ASSISTANT:  Jaynie Collins, MD  ANESTHESIA:  Spinal, Leilani Able, MD.  ESTIMATED BLOOD LOSS:  750 mL.  SPECIMENS:  Placenta to Pathology.  FINDINGS:  Viable female infant, Apgars of 8 and 10 at 1 and 5 minutes respectively.  Birth weight 4 pounds 15 ounces.  Arterial cord pH 7.16.  INDICATIONS AND CONSENT:  This patient is a 36 year old, G4, P1 at 51- 2/7 weeks with chronic hypertension.  She is on labetalol and Aldomet. Blood pressures have climbed progressively higher and she has developed periorbital edema.  She is admitted on the evening of March 25, 2012, for two-stage induction of labor.  She was started on magnesium sulfate, and labs were noted to be normal.  Blood pressures were 160s-180s/110- 120.  She was continued on her labetalol 300 mg and Aldomet 250 mg for the evening dose.  Blood pressures remained unchanged and she was given labetalol IV first 5, then 10, then 20 mg.  Baby remained reactive. Cytotec was placed.  Following that, her blood pressure drifted downward to the 140s over upper 80s.  This was followed by fetal deceleration for about 4-5 minutes that was in the 70s to 90s.  This recovered and the baby had good accelerations.  Blood pressures settled into the 150s/90s range.  This was remaining stable.  She had good urine output.  Baby then had a spontaneous deceleration initially to the  60s then to 70s to 90s that persisted for approximately 6-8 minutes.  She was treated with position change, IV fluids, oxygen administration.  She was also evaluated by Dr. Macon Large who thought she might have a tetanic contraction and gave her subcutaneous terbutaline.  When I arrived, fetal heartbeat was in the 90s to 100s.  She was advised to have emergent cesarean section.  DESCRIPTION OF PROCEDURE:  The patient was taken to the operating room. Fetal heart tones were in the 90s to 100s.  She was identified.  Spinal anesthetic was placed.  She was placed in the dorsal supine position with 15-degree left lateral wedge.  She was prepped.  Foley catheter was placed.  The bladder was drained.  She was draped in sterile fashion. After testing for adequate spinal anesthesia, skin was entered through a Pfannenstiel incision and dissection was carried out in layers to the peritoneum which was bluntly entered and extended bluntly. Vesicouterine peritoneum was taken down.  Bladder flap was developed. The bladder blade was placed.  Uterus was incised in a low transverse manner.  The uterine cavity was entered bluntly with a hemostat.  The uterine incision was extended cephalolaterally with fingers.  Vertex delivered without difficulty.  Good cry  and tone and vigorous baby was encountered.  Cord was clamped and cut.  The baby was handed to the awaiting pediatrics team.  Placenta was manually delivered and sent to Pathology.  Uterus was clean.  Uterus was closed in 2 running locking imbricating layers of 0 Monocryl suture which achieved good hemostasis. Anterior peritoneum was closed in running fashion with 0 Monocryl suture, which was also used to reapproximate the pyramidalis muscle in midline.  Anterior rectus fascia was closed in a running fashion with a 0 looped PDS suture.  Subcutaneous tissues were closed with interrupted plain suture and the skin was closed with a subcuticular 4-0 Vicryl on  a Keith needle.  All counts were correct.  The patient was taken to recovery room in stable condition.     Guy Sandifer Henderson Cloud, M.D.     JET/MEDQ  D:  03/26/2012  T:  03/26/2012  Job:  [id number]

## 2012-03-27 LAB — COMPREHENSIVE METABOLIC PANEL
ALT: 10 U/L (ref 0–35)
CO2: 25 mEq/L (ref 19–32)
Calcium: 7.9 mg/dL — ABNORMAL LOW (ref 8.4–10.5)
Chloride: 100 mEq/L (ref 96–112)
GFR calc Af Amer: >90 mL/min (ref >90)
GFR calc non Af Amer: 89 mL/min — ABNORMAL LOW (ref >90)
Glucose, Bld: 114 mg/dL — ABNORMAL HIGH (ref 70–99)
Sodium: 131 mEq/L — ABNORMAL LOW (ref 135–145)
Total Bilirubin: 0.1 mg/dL — ABNORMAL LOW (ref 0.3–1.2)

## 2012-03-27 LAB — CBC
Hemoglobin: 6.5 g/dL — CL (ref 12.0–15.0)
MCH: 25.4 pg — ABNORMAL LOW (ref 26.0–34.0)
MCV: 78.1 fL (ref 78.0–100.0)
RBC: 2.56 MIL/uL — ABNORMAL LOW (ref 3.87–5.11)

## 2012-03-27 MED ORDER — AZITHROMYCIN 500 MG PO TABS
500.0000 mg | ORAL_TABLET | Freq: Every day | ORAL | Status: AC
  Administered 2012-03-27: 500 mg via ORAL
  Filled 2012-03-27: qty 1

## 2012-03-27 MED ORDER — LABETALOL HCL 200 MG PO TABS
200.0000 mg | ORAL_TABLET | Freq: Three times a day (TID) | ORAL | Status: DC
  Administered 2012-03-27 (×3): 200 mg via ORAL
  Filled 2012-03-27 (×4): qty 1

## 2012-03-27 MED ORDER — BUTALBITAL-APAP-CAFFEINE 50-325-40 MG PO TABS
1.0000 | ORAL_TABLET | Freq: Four times a day (QID) | ORAL | Status: DC | PRN
  Administered 2012-03-27 – 2012-03-28 (×4): 1 via ORAL
  Administered 2012-03-28: 2 via ORAL
  Filled 2012-03-27: qty 2
  Filled 2012-03-27 (×4): qty 1

## 2012-03-27 MED ORDER — SODIUM CHLORIDE 0.9 % IJ SOLN
3.0000 mL | INTRAMUSCULAR | Status: DC | PRN
Start: 2012-03-27 — End: 2012-03-29
  Administered 2012-03-27: 3 mL via INTRAVENOUS

## 2012-03-27 MED ORDER — FERROUS SULFATE 325 (65 FE) MG PO TABS
325.0000 mg | ORAL_TABLET | Freq: Every day | ORAL | Status: DC
  Administered 2012-03-28: 325 mg via ORAL
  Filled 2012-03-27 (×2): qty 1

## 2012-03-27 MED ORDER — AZITHROMYCIN 250 MG PO TABS
250.0000 mg | ORAL_TABLET | Freq: Every day | ORAL | Status: DC
  Administered 2012-03-28 – 2012-03-29 (×2): 250 mg via ORAL
  Filled 2012-03-27 (×2): qty 1

## 2012-03-27 NOTE — Progress Notes (Signed)
Subjective: Postpartum Day 1 Cesarean Delivery Patient reports incisional pain, tolerating PO and no problems voiding.   Reports upper respiratory congestion Objective: Vital signs in last 24 hours: Temp:  [97.5 F (36.4 C)-98.1 F (36.7 C)] 97.8 F (36.6 C) (04/27 0825) Pulse Rate:  [81-118] 118  (04/27 0905) Resp:  [16-22] 18  (04/27 0905) BP: (108-170)/(55-94) 154/92 mmHg (04/27 0905) SpO2:  [96 %-100 %] 100 % (04/27 0905) Weight:  [112.084 kg (247 lb 1.6 oz)] 112.084 kg (247 lb 1.6 oz) (04/27 0981)  Physical Exam:  General: alert, cooperative and appears stated age Lochia: appropriate Uterine Fundus: firm Incision: healing well, no significant drainage, no dehiscence DVT Evaluation: No evidence of DVT seen on physical exam.   Basename 03/27/12 0520 03/26/12 1055  HGB 6.5* 8.1*  HCT 20.0* 25.2*    Assessment/Plan: Status post Cesarean section. Doing well postoperatively.  Continue current care Severe preeclampsia Stop Magnesium Transfer to floor later today zpack iron.  Shantil Vallejo L 03/27/2012, 9:30 AM

## 2012-03-27 NOTE — Plan of Care (Signed)
Problem: Discharge Progression Outcomes Goal: MMR given as ordered Given 03/26/12

## 2012-03-27 NOTE — Progress Notes (Signed)
03/27/12 1515 SBar report to Tia Alert, RN for pt transfer to [facility] rm#[id number]

## 2012-03-28 MED ORDER — LABETALOL HCL 300 MG PO TABS
300.0000 mg | ORAL_TABLET | Freq: Three times a day (TID) | ORAL | Status: DC
  Administered 2012-03-28 – 2012-03-29 (×4): 300 mg via ORAL
  Filled 2012-03-28 (×4): qty 1

## 2012-03-28 MED ORDER — SALINE SPRAY 0.65 % NA SOLN
1.0000 | NASAL | Status: DC | PRN
  Administered 2012-03-28: 1 via NASAL
  Filled 2012-03-28: qty 44

## 2012-03-28 NOTE — Progress Notes (Signed)
Subjective: Postpartum Day 2 Cesarean Delivery Patient reports tolerating PO, + flatus, + BM and no problems voiding.   Complains of mild headache with some congestion - started on zpack yesterday.  Objective: Vital signs in last 24 hours: Temp:  [97.6 F (36.4 C)-99.2 F (37.3 C)] 97.6 F (36.4 C) (04/28 0630) Pulse Rate:  [92-123] 92  (04/28 0659) Resp:  [18-20] 20  (04/28 0630) BP: (131-165)/(72-95) 144/95 mmHg (04/28 0659) SpO2:  [98 %-100 %] 100 % (04/27 1700)  Physical Exam:  General: alert, cooperative and appears stated age Lochia: appropriate Uterine Fundus: firm Incision: healing well, no significant drainage, no dehiscence, no significant erythema DVT Evaluation: No evidence of DVT seen on physical exam.   Basename 03/27/12 0520 03/26/12 1055  HGB 6.5* 8.1*  HCT 20.0* 25.2*    Assessment/Plan: Status post Cesarean section and severe preeclampsia.  Doing well postoperatively.  Continue current care Adjust labetalol to 300 tid and continue aldomet. Ocean nasal spray Continue zpack Discharge tomorrow  Leiliana Foody L 03/28/2012, 9:36 AM

## 2012-03-29 ENCOUNTER — Encounter (HOSPITAL_COMMUNITY): Payer: Self-pay | Admitting: Obstetrics and Gynecology

## 2012-03-29 MED ORDER — LABETALOL HCL 300 MG PO TABS: 300.0000 mg | ORAL_TABLET | Freq: Three times a day (TID) | ORAL | Status: DC

## 2012-03-29 MED ORDER — IBUPROFEN 600 MG PO TABS: 600.0000 mg | ORAL_TABLET | Freq: Four times a day (QID) | ORAL | Status: DC

## 2012-03-29 MED ORDER — FERROUS SULFATE 325 (65 FE) MG PO TABS: 325.0000 mg | ORAL_TABLET | Freq: Three times a day (TID) | ORAL | Status: DC

## 2012-03-29 MED ORDER — AZITHROMYCIN 250 MG PO TABS: ORAL_TABLET | ORAL | Status: DC

## 2012-03-29 MED ORDER — HYDROCODONE-ACETAMINOPHEN 5-325 MG PO TABS: 1.0000 | ORAL_TABLET | ORAL | Status: AC | PRN

## 2012-03-29 NOTE — Progress Notes (Cosign Needed)
Subjective: Postpartum Day 3: Cesarean Delivery Patient reports tolerating PO, + BM and no problems voiding.  Denies HA, blurred vision or SOB  Objective: Vital signs in last 24 hours: Temp:  [98 F (36.7 C)-98.6 F (37 C)] 98 F (36.7 C) (04/29 0605) Pulse Rate:  [84-107] 97  (04/29 0605) Resp:  [18-20] 18  (04/29 0605) BP: (135-158)/(75-102) 143/82 mmHg (04/29 1610)  Physical Exam:  General: alert and cooperative Lochia: appropriate Uterine Fundus: firm Incision: healing well DVT Evaluation: No evidence of DVT seen on physical exam. DTR's 1+  Pedal edema noted   Basename 03/27/12 0520 03/26/12 1055  HGB 6.5* 8.1*  HCT 20.0* 25.2*    Assessment/Plan: Status post Cesarean section. Postoperative course complicated by severe preeclampsia and anemia  Discharge home with standard precautions and return to clinic in 3 days for bp check and HGB.  Sotero Brinkmeyer G 03/29/2012, 8:18 AM

## 2012-03-29 NOTE — Discharge Summary (Signed)
Obstetric Discharge Summary Reason for Admission: induction of labor Prenatal Procedures: ultrasound Intrapartum Procedures: cesarean: low cervical, transverse Postpartum Procedures: none Complications-Operative and Postpartum: none Hemoglobin  Date Value Range Status  03/27/2012 6.5* 12.0-15.0 (Brown/dL) Final     REPEATED TO VERIFY     DELTA CHECK NOTED     CRITICAL RESULT CALLED TO, READ BACK BY AND VERIFIED WITH:     Gloris Manchester, RN AT [phone] 03/27/12 BY KJAMES     HCT  Date Value Range Status  03/27/2012 20.0* 36.0-46.0 (%) Final    Physical Exam:  General: alert and cooperative Lochia: appropriate Uterine Fundus: firm Incision: healing well DVT Evaluation: No evidence of DVT seen on physical exam.  Discharge Diagnoses: Term Pregnancy-delivered and Preelampsia  Discharge Information: Date: 03/29/2012 Activity: pelvic rest Diet: routine Medications: PNV, Ibuprofen, Vicodin and labetalol and Aldomet Condition: stable Instructions: refer to practice specific booklet Discharge to: home   Newborn Data: Live born female  Birth Weight: 4 lb 14.5 oz (2225 Brown) APGAR: 8, 10  Home with mother.  Jeanette Brown 03/29/2012, 8:38 AM

## 2012-03-30 ENCOUNTER — Encounter (HOSPITAL_COMMUNITY): Payer: Self-pay | Admitting: *Deleted

## 2012-03-30 ENCOUNTER — Emergency Department (HOSPITAL_COMMUNITY): Payer: 59

## 2012-03-30 ENCOUNTER — Other Ambulatory Visit: Payer: Self-pay

## 2012-03-30 ENCOUNTER — Observation Stay (HOSPITAL_COMMUNITY)
Admission: EM | Admit: 2012-03-30 | Discharge: 2012-03-31 | DRG: 189 | Disposition: A | Discharge status: Home / Self Care | Payer: 59 | Attending: Internal Medicine | Admitting: Internal Medicine

## 2012-03-30 DIAGNOSIS — IMO0002 Reserved for concepts with insufficient information to code with codable children: Secondary | ICD-10-CM | POA: Diagnosis present

## 2012-03-30 DIAGNOSIS — J811 Chronic pulmonary edema: Principal | ICD-10-CM | POA: Diagnosis present

## 2012-03-30 DIAGNOSIS — Z98891 History of uterine scar from previous surgery: Secondary | ICD-10-CM

## 2012-03-30 DIAGNOSIS — J189 Pneumonia, unspecified organism: Secondary | ICD-10-CM

## 2012-03-30 DIAGNOSIS — D649 Anemia, unspecified: Secondary | ICD-10-CM

## 2012-03-30 DIAGNOSIS — J180 Bronchopneumonia, unspecified organism: Secondary | ICD-10-CM

## 2012-03-30 DIAGNOSIS — I10 Essential (primary) hypertension: Secondary | ICD-10-CM

## 2012-03-30 DIAGNOSIS — R0600 Dyspnea, unspecified: Secondary | ICD-10-CM

## 2012-03-30 DIAGNOSIS — O1490 Unspecified pre-eclampsia, unspecified trimester: Secondary | ICD-10-CM

## 2012-03-30 DIAGNOSIS — I169 Hypertensive crisis, unspecified: Secondary | ICD-10-CM | POA: Diagnosis present

## 2012-03-30 DIAGNOSIS — D62 Acute posthemorrhagic anemia: Secondary | ICD-10-CM

## 2012-03-30 LAB — DIC (DISSEMINATED INTRAVASCULAR COAGULATION)PANEL
Fibrinogen: 605 mg/dL — ABNORMAL HIGH (ref 204–475)
INR: 0.97 (ref 0.00–1.49)
Platelets: 400 10*3/uL (ref 150–400)
aPTT: 33 seconds (ref 24–37)

## 2012-03-30 LAB — URINALYSIS, ROUTINE W REFLEX MICROSCOPIC
Nitrite: NEGATIVE
Protein, ur: NEGATIVE mg/dL
Specific Gravity, Urine: 1.009 (ref 1.005–1.030)
Urobilinogen, UA: 0.2 mg/dL (ref 0.0–1.0)

## 2012-03-30 LAB — CBC
HCT: 21.3 % — ABNORMAL LOW (ref 36.0–46.0)
Hemoglobin: 7 g/dL — ABNORMAL LOW (ref 12.0–15.0)
MCH: 25 pg — ABNORMAL LOW (ref 26.0–34.0)
MCHC: 32.9 g/dL (ref 30.0–36.0)
MCHC: 32.6 g/dL (ref 30.0–36.0)
MCV: 76.6 fL — ABNORMAL LOW (ref 78.0–100.0)
MCV: 76.8 fL — ABNORMAL LOW (ref 78.0–100.0)
Platelets: 417 10*3/uL — ABNORMAL HIGH (ref 150–400)
RBC: 2.84 MIL/uL — ABNORMAL LOW (ref 3.87–5.11)
WBC: 10.3 10*3/uL (ref 4.0–10.5)

## 2012-03-30 LAB — ABO/RH: ABO/RH(D): O POS

## 2012-03-30 LAB — DIFFERENTIAL
Eosinophils Relative: 4 % (ref 0–5)
Lymphocytes Relative: 13 % (ref 12–46)
Monocytes Absolute: 0.4 10*3/uL (ref 0.1–1.0)
Monocytes Relative: 4 % (ref 3–12)
Neutro Abs: 7.8 10*3/uL — ABNORMAL HIGH (ref 1.7–7.7)

## 2012-03-30 LAB — COMPREHENSIVE METABOLIC PANEL
BUN: 7 mg/dL (ref 6–23)
CO2: 22 mEq/L (ref 19–32)
Calcium: 9.3 mg/dL (ref 8.4–10.5)
Chloride: 105 mEq/L (ref 96–112)
Creatinine, Ser: 0.82 mg/dL (ref 0.50–1.10)
GFR calc Af Amer: >90 mL/min (ref >90)
GFR calc non Af Amer: >90 mL/min (ref >90)
Glucose, Bld: 90 mg/dL (ref 70–99)
Total Bilirubin: 0.3 mg/dL (ref 0.3–1.2)

## 2012-03-30 LAB — CARDIAC PANEL(CRET KIN+CKTOT+MB+TROPI)
Relative Index: 2 (ref 0.0–2.5)
Total CK: 213 U/L — ABNORMAL HIGH (ref 7–177)

## 2012-03-30 LAB — TYPE AND SCREEN: Antibody Screen: NEGATIVE

## 2012-03-30 LAB — CREATININE, SERUM: Creatinine, Ser: 0.88 mg/dL (ref 0.50–1.10)

## 2012-03-30 LAB — GLUCOSE, CAPILLARY: Glucose-Capillary: 140 mg/dL — ABNORMAL HIGH (ref 70–99)

## 2012-03-30 LAB — TROPONIN I: Troponin I: <0.3 ng/mL (ref <0.30)

## 2012-03-30 LAB — URINE MICROSCOPIC-ADD ON

## 2012-03-30 MED ORDER — DEXTROSE 5 % IV SOLN
500.0000 mg | Freq: Once | INTRAVENOUS | Status: DC
  Filled 2012-03-30: qty 500

## 2012-03-30 MED ORDER — ACETAMINOPHEN 650 MG RE SUPP: 650.0000 mg | Freq: Four times a day (QID) | RECTAL | Status: DC | PRN

## 2012-03-30 MED ORDER — ONDANSETRON HCL 4 MG/2ML IJ SOLN: 4.0000 mg | Freq: Four times a day (QID) | INTRAMUSCULAR | Status: DC | PRN

## 2012-03-30 MED ORDER — ONDANSETRON HCL 4 MG PO TABS: 4.0000 mg | ORAL_TABLET | Freq: Four times a day (QID) | ORAL | Status: DC | PRN

## 2012-03-30 MED ORDER — SODIUM CHLORIDE 0.9 % IV SOLN: 3.0000 g | INTRAVENOUS | Status: DC

## 2012-03-30 MED ORDER — PRENATAL MULTIVITAMIN CH
1.0000 | ORAL_TABLET | Freq: Every day | ORAL | Status: DC
  Administered 2012-03-31: 1 via ORAL
  Filled 2012-03-30 (×2): qty 1

## 2012-03-30 MED ORDER — IOHEXOL 350 MG/ML SOLN
100.0000 mL | Freq: Once | INTRAVENOUS | Status: AC | PRN
  Administered 2012-03-30: 100 mL via INTRAVENOUS

## 2012-03-30 MED ORDER — SODIUM CHLORIDE 0.9 % IV SOLN
INTRAVENOUS | Status: DC
  Administered 2012-03-30: 20:00:00 via INTRAVENOUS

## 2012-03-30 MED ORDER — METHYLDOPA 250 MG PO TABS
250.0000 mg | ORAL_TABLET | Freq: Two times a day (BID) | ORAL | Status: DC
  Administered 2012-03-30: 250 mg via ORAL
  Filled 2012-03-30 (×4): qty 1

## 2012-03-30 MED ORDER — MORPHINE SULFATE 2 MG/ML IJ SOLN: 1.0000 mg | INTRAMUSCULAR | Status: DC | PRN

## 2012-03-30 MED ORDER — LABETALOL HCL 300 MG PO TABS
300.0000 mg | ORAL_TABLET | Freq: Three times a day (TID) | ORAL | Status: DC
  Filled 2012-03-30: qty 1

## 2012-03-30 MED ORDER — HYDROCODONE-ACETAMINOPHEN 5-325 MG PO TABS: 1.0000 | ORAL_TABLET | ORAL | Status: DC | PRN

## 2012-03-30 MED ORDER — FERROUS SULFATE 325 (65 FE) MG PO TABS
325.0000 mg | ORAL_TABLET | Freq: Three times a day (TID) | ORAL | Status: DC
  Administered 2012-03-31: 325 mg via ORAL
  Filled 2012-03-30 (×4): qty 1

## 2012-03-30 MED ORDER — ALBUTEROL SULFATE (5 MG/ML) 0.5% IN NEBU
2.5000 mg | INHALATION_SOLUTION | RESPIRATORY_TRACT | Status: DC | PRN
Start: 2012-03-30 — End: 2012-03-31
  Administered 2012-03-30: 2.5 mg via RESPIRATORY_TRACT
  Filled 2012-03-30: qty 0.5

## 2012-03-30 MED ORDER — DEXTROSE 5 % IV SOLN
500.0000 mg | INTRAVENOUS | Status: DC
  Administered 2012-03-30: 500 mg via INTRAVENOUS
  Filled 2012-03-30 (×3): qty 500

## 2012-03-30 MED ORDER — METHYLDOPA 250 MG PO TABS
250.0000 mg | ORAL_TABLET | Freq: Two times a day (BID) | ORAL | Status: DC
  Filled 2012-03-30: qty 1

## 2012-03-30 MED ORDER — AMPICILLIN-SULBACTAM SODIUM 3 (2-1) G IJ SOLR
3.0000 g | Freq: Four times a day (QID) | INTRAMUSCULAR | Status: DC
  Administered 2012-03-30 – 2012-03-31 (×2): 3 g via INTRAVENOUS
  Filled 2012-03-30 (×4): qty 3

## 2012-03-30 MED ORDER — ACETAMINOPHEN 325 MG PO TABS: 650.0000 mg | ORAL_TABLET | Freq: Four times a day (QID) | ORAL | Status: DC | PRN

## 2012-03-30 MED ORDER — VANCOMYCIN HCL IN DEXTROSE 1-5 GM/200ML-% IV SOLN
1000.0000 mg | Freq: Two times a day (BID) | INTRAVENOUS | Status: DC
  Administered 2012-03-31: 1000 mg via INTRAVENOUS
  Filled 2012-03-30 (×2): qty 200

## 2012-03-30 MED ORDER — VANCOMYCIN HCL 1000 MG IV SOLR
1750.0000 mg | Freq: Once | INTRAVENOUS | Status: DC
  Filled 2012-03-30: qty 1750

## 2012-03-30 MED ORDER — DEXTROSE 5 % IV SOLN
1.0000 g | Freq: Once | INTRAVENOUS | Status: AC
  Administered 2012-03-30: 1 g via INTRAVENOUS
  Filled 2012-03-30: qty 10

## 2012-03-30 MED ORDER — LABETALOL HCL 300 MG PO TABS
300.0000 mg | ORAL_TABLET | Freq: Three times a day (TID) | ORAL | Status: DC
  Administered 2012-03-30 – 2012-03-31 (×2): 300 mg via ORAL
  Filled 2012-03-30 (×4): qty 1

## 2012-03-30 MED ORDER — ENOXAPARIN SODIUM 40 MG/0.4ML ~~LOC~~ SOLN
40.0000 mg | Freq: Every day | SUBCUTANEOUS | Status: DC
  Administered 2012-03-30: 40 mg via SUBCUTANEOUS
  Filled 2012-03-30 (×2): qty 0.4

## 2012-03-30 MED ORDER — FLUTICASONE PROPIONATE 50 MCG/ACT NA SUSP
2.0000 | Freq: Every day | NASAL | Status: DC
  Administered 2012-03-31: 2 via NASAL
  Filled 2012-03-30: qty 16

## 2012-03-30 NOTE — Progress Notes (Addendum)
ANTIBIOTIC CONSULT NOTE - INITIAL  Pharmacy Consult for Vancomycin Indication: endocarditis  No Known Allergies  Patient Measurements: Height: 5' 4.5" (163.8 cm) Weight: 240 lb (108.863 kg) IBW/kg (Calculated) : 55.85  Adjusted Body Weight:   Vital Signs: Temp: 98 F (36.7 C) (04/30 1503) Temp src: Oral (04/30 1503) BP: 149/102 mmHg (04/30 1503) Pulse Rate: 101  (04/30 1503) Intake/Output from previous day:   Intake/Output from this shift:    Labs:  Basename 03/30/12 1516 03/30/12 1259  WBC -- 10.3  HGB -- 7.0*  PLT 400 357  LABCREA -- --  CREATININE -- 0.82   Estimated Creatinine Clearance: 118.8 ml/min (by C-G formula based on Cr of 0.82). No results found for this basename: VANCOTROUGH:2,VANCOPEAK:2,VANCORANDOM:2,GENTTROUGH:2,GENTPEAK:2,GENTRANDOM:2,TOBRATROUGH:2,TOBRAPEAK:2,TOBRARND:2,AMIKACINPEAK:2,AMIKACINTROU:2,AMIKACIN:2, in the last 72 hours   Microbiology: Recent Results (from the past 720 hour(s))  OB RESULTS CONSOLE GBS     Status: Normal      Component Value Range Status Comment   GBS Negative      MRSA PCR SCREENING     Status: Normal   Collection Time   03/26/12  6:00 AM      Component Value Range Status Comment   MRSA by PCR NEGATIVE  NEGATIVE  Final     Medical History: Past Medical History  Diagnosis Date  . 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 Allergic rhinitis due to pollen   . Neutropenia, unspecified   . Obesity, unspecified   . Hypertension     Medications:  Scheduled:    . azithromycin (ZITHROMAX) 500 MG IVPB  500 mg Intravenous Once  . cefTRIAXone (ROCEPHIN)  IV  1 g Intravenous Once  . vancomycin  1,750 mg Intravenous Once  . vancomycin  1,000 mg Intravenous Q12H   Assessment: 36 yr old female admitted with chest pain and SOB. She had a baby by c-section on 4/26. She is NOT breast feeding and does not intend to at all per MD note. She is to receive Vancomycin for endocarditis.   Goal of Therapy:    Vancomycin trough level 15-20 mcg/ml  Plan:  Will give a loading dose of Vancomycin 1750 mg, then 100 mg q12hrs. Vancomycin trough level when appropriate.  Eugene Garnet 03/30/2012,5:50 PM  Addendum:  Pharmacy also asked to start Unasyn for this patient. As noted above, she is NOT breastfeeding.  Begin Unasyn 3 g IV q6h  Loura Back, PharmD, BCPS 03/30/2012 6:18 PM

## 2012-03-30 NOTE — H&P (Addendum)
PATIENT DETAILS Name: Jeanette Brown Age: 36 y.o. Sex: female Date of Birth: 01/15/1976 Admit Date: 03/30/2012 WUJ:WJXB, ERIC, MD, MD   CHIEF COMPLAINT:  Shortness of breath  HPI: Patient is a 36 year old female with a past medical history of hypertension, recent history of preeclampsia and subsequent cesarean section 2 days ago, who was following up with her regular obstetrician today and was sent for evaluation of shortness of breath. The patient for the past few months she has been short of breath, however when she sat went home after having a baby, shortness of breath has worsened. The shortness of breath is mostly when she exerts, she claims that even walking to the bathroom makes her short of breath. She denies any fever. She claims to have some dry cough. She was evaluated in the ED and a CT angiogram of the chest was done which was negative for pulmonary embolism, however she had these diffuse infiltrates all over and I was called to admit this patient for further evaluation and treatment. During my evaluation, she was not obviously tachypneic, she was just about able to complete a full sentence and was not using any accessory muscles of respiration. O2 saturation on room air was in the high 90s. She has had chronic lower extremity swelling since one month into her pregnancy. Please note, patient is not breast-feeding and does not intend to breast-feed at all.   ALLERGIES:  No Known Allergies  PAST MEDICAL HISTORY: Past Medical History  Diagnosis Date  . 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 Allergic rhinitis due to pollen   . Neutropenia, unspecified   . Obesity, unspecified   . Hypertension     PAST SURGICAL HISTORY: Past Surgical History  Procedure Date  . Cesarean section 03/26/2012    Procedure: CESAREAN SECTION;  Surgeon: Leslie Andrea, MD;  Location: [facility];  Service: Gynecology;  Laterality: N/A;  Primary Cesarean Section with birth of  baby boy @ 66 Apgars 64/10    MEDICATIONS AT HOME: Prior to Admission medications   Medication Sig Start Date End Date Taking? Authorizing Provider  ferrous sulfate 325 (65 FE) MG tablet Take 1 tablet (325 mg total) by mouth 3 (three) times daily with meals. 03/29/12 03/29/13 Yes Judith Blonder, NP  fluticasone (FLONASE) 50 MCG/ACT nasal spray Place 2 sprays into the nose daily. allergies   Yes Historical Provider, MD  HYDROcodone-acetaminophen (NORCO) 5-325 MG per tablet Take 1 tablet by mouth every 4 (four) hours as needed for pain. 03/29/12 04/08/12 Yes Judith Blonder, NP  ibuprofen (ADVIL,MOTRIN) 600 MG tablet Take 1 tablet (600 mg total) by mouth every 6 (six) hours. 03/29/12 04/08/12 Yes Judith Blonder, NP  labetalol (NORMODYNE) 300 MG tablet Take 1 tablet (300 mg total) by mouth 3 (three) times daily. 03/29/12 03/29/13 Yes Judith Blonder, NP  methyldopa (ALDOMET) 250 MG tablet Take 250 mg by mouth 2 (two) times daily.   Yes Historical Provider, MD  Prenatal Vit-Fe Fumarate-FA (PRENATAL MULTIVITAMIN) TABS Take 1 tablet by mouth daily.   Yes Historical Provider, MD    FAMILY HISTORY: Family History  Problem Relation Age of Onset  . Hypertension Mother   . Hypertension Father   . Heart disease Father     SOCIAL HISTORY:  reports that she has never smoked. She has never used smokeless tobacco. She reports that she does not drink alcohol or use illicit drugs.  REVIEW OF SYSTEMS:  Constitutional:   No  weight loss, night sweats,  Fevers, chills, fatigue.  HEENT:    No headaches, Difficulty swallowing,Tooth/dental problems,Sore throat,  No sneezing, itching, ear ache, nasal congestion, post nasal drip,   Cardio-vascular: No chest pain, anasarca, dizziness, palpitations  GI:  No heartburn, indigestion, abdominal pain, nausea, vomiting, diarrhea, change in bowel habits, loss of appetite  Resp: .  No excess mucus,No coughing up of blood.No change in color of mucus.No wheezing.No chest  wall deformity  Skin:  no rash or lesions.  GU:  no dysuria, change in color of urine, no urgency or frequency.  No flank pain.  Musculoskeletal: No joint pain or swelling.  No decreased range of motion.  No back pain.  Psych: No change in mood or affect. No depression or anxiety.  No memory loss.   PHYSICAL EXAM: Blood pressure 149/102, pulse 101, temperature 98 F (36.7 C), temperature source Oral, resp. rate 20, height 5' 4.5" (1.638 m), weight 108.863 kg (240 lb), SpO2 96.00%, unknown if currently breastfeeding.  General appearance :Awake, alert, not in any distress. Speech Clear. Not toxic Looking HEENT: Atraumatic and Normocephalic, pupils equally reactive to light and accomodation Neck: supple, no JVD. No cervical lymphadenopathy.  Chest:Good air entry bilaterally, few bibasilar rales  CVS: S1 S2 regular, no murmurs.  Abdomen: Bowel sounds present, Non tender and not distended with no gaurding, rigidity or rebound. Extremities: B/L Lower Ext shows 2+ edema, both legs are warm to touch, with  dorsalis pedis pulses palpable. Neurology: Awake alert, and oriented X 3, CN II-XII intact, Non focal, Deep Tendon Reflex-2+ all over, plantar's downgoing B/L, sensory exam is grossly intact.  Skin:No Rash Wounds:N/A  LABS ON ADMISSION:   Basename 03/30/12 1259  NA 138  K 4.4  CL 105  CO2 22  GLUCOSE 90  BUN 7  CREATININE 0.82  CALCIUM 9.3  MG --  PHOS --    Basename 03/30/12 1259  AST 42*  ALT 57*  ALKPHOS 115  BILITOT 0.3  PROT 6.4  ALBUMIN 2.5*   No results found for this basename: LIPASE:2,AMYLASE:2 in the last 72 hours  Basename 03/30/12 1516 03/30/12 1259  WBC -- 10.3  NEUTROABS -- 7.8*  HGB -- 7.0*  HCT -- 21.3*  MCV -- 76.6*  PLT 400 357    Basename 03/30/12 1302  CKTOTAL --  CKMB --  CKMBINDEX --  TROPONINI <0.30    Basename 03/30/12 1516  DDIMER 5.88*   No components found with this basename: POCBNP:3   RADIOLOGIC STUDIES ON  ADMISSION: Dg Chest 2 View  03/30/2012  *RADIOLOGY REPORT*  Clinical Data: Cough and shortness of breath.  CHEST - 2 VIEW  Comparison: Chest x-ray 01/23/2010.  Findings: Lung volumes are normal.  There appears to be some mild diffuse bronchial wall thickening.  No consolidative airspace disease.  No pleural effusions.  Pulmonary vasculature and the cardiomediastinal silhouette are within normal limits.  IMPRESSION:  1.  Mild diffuse bronchial wall thickening.  This can be seen in the setting of mild bronchitis or reactive airway disease. Clinical correlation is recommended.  Original Report Authenticated By: Florencia Reasons, M.D.   Ct Angio Chest W/cm &/or Wo Cm  03/30/2012  *RADIOLOGY REPORT*  Clinical Data: Shortness of breath.  Cough.  Recent delivery via C- section.  CT ANGIOGRAPHY CHEST  Technique:  Multidetector CT imaging of the chest using the standard protocol during bolus administration of intravenous contrast. Multiplanar reconstructed images including MIPs were obtained and reviewed to evaluate the vascular anatomy.  Contrast: OMNIPAQUE IOHEXOL 350 MG/ML SOLN  Comparison: No priors.  Findings:  Mediastinum: There are no filling defects within the pulmonary arterial tree to suggest underlying pulmonary embolism. Heart size is borderline enlarged. There is no significant pericardial fluid, thickening or pericardial calcification. There is a small hiatal hernia. No pathologically enlarged mediastinal or hilar No acute abnormality of the thoracic aorta; specifically, no aneurysm or dissection.  lymph nodes.  Lungs/Pleura: There is extensive multifocal peribronchovascular ground-glass attenuation and ground-glass attenuation nodularity throughout the lungs bilaterally.  This has a slight upper lung predominance.  Small bilateral pleural effusions are layering dependently.  Upper Abdomen: Unremarkable.  Musculoskeletal: There are no aggressive appearing lytic or blastic lesions noted in the  visualized portions of the skeleton.  IMPRESSION: 1.  No evidence of pulmonary embolism. 2.  However, there is extensive multifocal peribronchovascular ground-glass attenuation airspace disease and ground-glass attenuation nodularity throughout the lungs bilaterally with a slight upper lung predominance.  The appearance is suggestive of a multilobar bronchopneumonia.  Given the upper lung predominance and none random distribution (peribronchovascular distribution) sequela of septic emboli is felt less likely. Given the recent delivery, one other differential that warrants consideration may be amniotic fluid embolus, which can present with a similar radiographic appearance and symptoms of disseminated intravascular coagulation. Clinical correlation is recommended. 3.  Small bilateral pleural effusions.  These results were called by telephone on 03/30/2012  at  03:05 p.m. to  Dr.Plunkett, who verbally acknowledged these results.  Original Report Authenticated By: Florencia Reasons, M.D.    ASSESSMENT AND PLAN: Present on Admission:  .PNA (pneumonia) -She has bilateral airspace disease, not sure whether this represents a community-acquired/healthcare acquired pneumonia or is a manifestation of septic emboli (Endocarditis). I did discuss this case and a radiological images with Dr. Molli Knock of pulmonary critical care medicine, he doubted patient to have amniotic fluid embolism. He suggested admission and empiric antibiotics for now, PCCM will evaluate the patient as well tomorrow.  -The meantime I will check a ESR, ANA and other autoimmune workup. A 2-D echocardiogram will be ordered. Blood cultures were drawn in the emergency room. Empiric vancomycin Unasyn and Zithromax will be started for now. Antibiotics can be tapered off depending on patient's clinical course.   Marland KitchenHTN (hypertension) -We will continue labetalol and alpha methyldopa   .Pre-eclampsia -She does have lower extremity swelling, that is  chronic, BNP is also mildly elevated. We will check a 2-D echocardiogram.  Recent pregnancy and subsequent cesarean section -Patient has been encouraged to pump her breast milk to prevent breast engorgement. There is a breast pump at bedside currently.  Please note, patient is not breast-feeding and does not intend to breast-feed at all.  Anemia -Discharge hemoglobin was 6.5 following her delivery, today it is up to 7. Her shortness of breath is likely secondary to the numerous pulmonary infiltrates and not from anemia. We'll set up an anemia panel type and screen and only transfuse if hemoglobin continues to deteriorate.  Further plan will depend as patient's clinical course evolves and further radiologic and laboratory data become available. Patient will be monitored closely.  DVT Prophylaxis: Prophylactic Lovenox  Code Status: Full code  Total time spent for admission equals 45 minutes.  Jeoffrey Massed 03/30/2012, 5:45 PM

## 2012-03-30 NOTE — ED Notes (Signed)
[id number] Ready

## 2012-03-30 NOTE — ED Notes (Signed)
Pt just had baby via c-section on Thursday and now with constant cough and shortness of breath adn sent here by ob/gyn r/o chf/fluid in lungs

## 2012-03-30 NOTE — ED Provider Notes (Signed)
History     CSN: [id number]  Arrival date & time 03/30/12  1144   First MD Initiated Contact with Patient 03/30/12 1159      Chief Complaint  Patient presents with  . Shortness of Breath    post delivery on thursday by c-section    (Consider location/radiation/quality/duration/timing/severity/associated sxs/prior treatment) Patient is a 36 y.o. female presenting with shortness of breath. The history is provided by the patient.  Shortness of Breath  The current episode started yesterday. The problem occurs continuously. The problem has been gradually worsening. The problem is severe. The symptoms are relieved by rest. The symptoms are aggravated by activity and a supine position. Associated symptoms include chest pain, cough and shortness of breath. Pertinent negatives include no fever. The cough is non-productive. She has been less active. Urine output has been normal. Recently, medical care has been given at another facility. Services received include medications given and tests performed.    Past Medical History  Diagnosis Date  . 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 Allergic rhinitis due to pollen   . Neutropenia, unspecified   . Obesity, unspecified   . Hypertension     Past Surgical History  Procedure Date  . Cesarean section 03/26/2012    Procedure: CESAREAN SECTION;  Surgeon: Leslie Andrea, MD;  Location: [facility];  Service: Gynecology;  Laterality: N/A;  Primary Cesarean Section with birth of baby boy @ 25 Apgars 16/10    Family History  Problem Relation Age of Onset  . Hypertension Mother   . Hypertension Father   . Heart disease Father     History  Substance Use Topics  . Smoking status: Never Smoker   . Smokeless tobacco: Never Used  . Alcohol Use: No    OB History    Grav Para Term Preterm Abortions TAB SAB Ect Mult Living   4 2 1 1 2 2    2       Review of Systems  Constitutional: Negative for fever.  HENT: Positive  for congestion.   Eyes: Negative for visual disturbance.  Respiratory: Positive for cough and shortness of breath.   Cardiovascular: Positive for chest pain.  Gastrointestinal: Positive for abdominal pain (at incision site). Negative for nausea, vomiting and diarrhea.  Genitourinary: Positive for vaginal bleeding. Negative for dysuria.  Musculoskeletal: Positive for joint swelling.  Neurological: Negative for headaches.  All other systems reviewed and are negative.    Allergies  Review of patient's allergies indicates no known allergies.  Home Medications   Current Outpatient Rx  Name Route Sig Dispense Refill  . FERROUS SULFATE 325 (65 FE) MG PO TABS Oral Take 1 tablet (325 mg total) by mouth 3 (three) times daily with meals. 90 tablet 1  . FLUTICASONE PROPIONATE 50 MCG/ACT NA SUSP Nasal Place 2 sprays into the nose daily. allergies    . HYDROCODONE-ACETAMINOPHEN 5-325 MG PO TABS Oral Take 1 tablet by mouth every 4 (four) hours as needed for pain. 30 tablet 0  . IBUPROFEN 600 MG PO TABS Oral Take 1 tablet (600 mg total) by mouth every 6 (six) hours. 30 tablet 1  . LABETALOL HCL 300 MG PO TABS Oral Take 1 tablet (300 mg total) by mouth 3 (three) times daily. 90 tablet 1  . METHYLDOPA 250 MG PO TABS Oral Take 250 mg by mouth 2 (two) times daily.    Marland Kitchen PRENATAL MULTIVITAMIN CH Oral Take 1 tablet by mouth daily.  BP 155/100  Pulse 100  Temp(Src) 98.1 F (36.7 C) (Oral)  Resp 22  SpO2 93%  Breastfeeding? Unknown  Physical Exam  Nursing note and vitals reviewed. Constitutional: She is oriented to person, place, and time. She appears well-developed and well-nourished.  HENT:  Head: Normocephalic and atraumatic.  Eyes: EOM are normal.  Neck: Normal range of motion.  Cardiovascular: Normal rate, regular rhythm and normal heart sounds.   Pulmonary/Chest: Breath sounds normal. Tachypnea noted. No respiratory distress. She has no wheezes.  Abdominal: Soft. There is no tenderness.   Musculoskeletal: Normal range of motion.       Right lower leg: She exhibits edema. She exhibits no tenderness.       Left lower leg: She exhibits edema. She exhibits no tenderness.  Neurological: She is alert and oriented to person, place, and time.  Skin: Skin is warm and dry.  Psychiatric: She has a normal mood and affect.    ED Course  Procedures (including critical care time)  Date: 03/30/2012  Rate: 94  Rhythm: normal sinus rhythm  QRS Axis: normal  Intervals: normal  ST/T Wave abnormalities: normal  Conduction Disutrbances:none  Narrative Interpretation:   Old EKG Reviewed: none available   Labs Reviewed  CBC - Abnormal; Notable for the following:    RBC 2.78 (*)    Hemoglobin 7.0 (*)    HCT 21.3 (*)    MCV 76.6 (*)    MCH 25.2 (*)    All other components within normal limits  DIFFERENTIAL - Abnormal; Notable for the following:    Neutrophils Relative 79 (*)    Neutro Abs 7.8 (*)    All other components within normal limits  COMPREHENSIVE METABOLIC PANEL - Abnormal; Notable for the following:    Albumin 2.5 (*)    AST 42 (*)    ALT 57 (*)    All other components within normal limits  URINALYSIS, ROUTINE W REFLEX MICROSCOPIC - Abnormal; Notable for the following:    Hgb urine dipstick LARGE (*)    Leukocytes, UA MODERATE (*)    All other components within normal limits  PRO B NATRIURETIC PEPTIDE - Abnormal; Notable for the following:    Pro B Natriuretic peptide (BNP) 856.4 (*)    All other components within normal limits  URINE MICROSCOPIC-ADD ON - Abnormal; Notable for the following:    Squamous Epithelial / LPF FEW (*)    Bacteria, UA FEW (*)    All other components within normal limits  TROPONIN I   Dg Chest 2 View  03/30/2012  *RADIOLOGY REPORT*  Clinical Data: Cough and shortness of breath.  CHEST - 2 VIEW  Comparison: Chest x-ray 01/23/2010.  Findings: Lung volumes are normal.  There appears to be some mild diffuse bronchial wall thickening.  No  consolidative airspace disease.  No pleural effusions.  Pulmonary vasculature and the cardiomediastinal silhouette are within normal limits.  IMPRESSION:  1.  Mild diffuse bronchial wall thickening.  This can be seen in the setting of mild bronchitis or reactive airway disease. Clinical correlation is recommended.  Original Report Authenticated By: Florencia Reasons, M.D.   Ct Angio Chest W/cm &/or Wo Cm  03/30/2012  *RADIOLOGY REPORT*  Clinical Data: Shortness of breath.  Cough.  Recent delivery via C- section.  CT ANGIOGRAPHY CHEST  Technique:  Multidetector CT imaging of the chest using the standard protocol during bolus administration of intravenous contrast. Multiplanar reconstructed images including MIPs were obtained and reviewed to evaluate the vascular  anatomy.  Contrast: OMNIPAQUE IOHEXOL 350 MG/ML SOLN  Comparison: No priors.  Findings:  Mediastinum: There are no filling defects within the pulmonary arterial tree to suggest underlying pulmonary embolism. Heart size is borderline enlarged. There is no significant pericardial fluid, thickening or pericardial calcification. There is a small hiatal hernia. No pathologically enlarged mediastinal or hilar No acute abnormality of the thoracic aorta; specifically, no aneurysm or dissection.  lymph nodes.  Lungs/Pleura: There is extensive multifocal peribronchovascular ground-glass attenuation and ground-glass attenuation nodularity throughout the lungs bilaterally.  This has a slight upper lung predominance.  Small bilateral pleural effusions are layering dependently.  Upper Abdomen: Unremarkable.  Musculoskeletal: There are no aggressive appearing lytic or blastic lesions noted in the visualized portions of the skeleton.  IMPRESSION: 1.  No evidence of pulmonary embolism. 2.  However, there is extensive multifocal peribronchovascular ground-glass attenuation airspace disease and ground-glass attenuation nodularity throughout the lungs bilaterally with  a slight upper lung predominance.  The appearance is suggestive of a multilobar bronchopneumonia.  Given the upper lung predominance and none random distribution (peribronchovascular distribution) sequela of septic emboli is felt less likely. Given the recent delivery, one other differential that warrants consideration may be amniotic fluid embolus, which can present with a similar radiographic appearance and symptoms of disseminated intravascular coagulation. Clinical correlation is recommended. 3.  Small bilateral pleural effusions.  These results were called by telephone on 03/30/2012  at  03:05 p.m. to  Dr.Plunkett, who verbally acknowledged these results.  Original Report Authenticated By: Florencia Reasons, M.D.     1. Dyspnea   2. Anemia   3. Bronchopneumonia       MDM  Patient is POD #4 from LT[facility]. Pregnancy complicated with pre-eclampsia, anemia.  Presents with dyspnea since last night. Also associated chest discomfort with deep breaths. Worse with exertion and lying flat.  Has had nonproductive cough.  Denies h/a, blurry vision, RUQ pain. Has gone through 5-6 pads since d/c yesterday am. No heavy bleeding.  Exam with clear lungs. Mild tachypnea but O2 sat in mid 90s. BL LE swelling without asymmetry or tenderness. Reflexes normal. Surgical site C/D/I. No RUQ pain.   Broad differential including anemia, PE, PNA, pulm edema/fluid overload, post-partum cardiomyopathy.   BNP elevated, concerning for fluid overload and possible cardiomyopathy.   CT with concern of bronchopneumonia. Radiologist also considered amniotic fluid embolism. However, pt not ill appearing. No heavy bleeding to suggest DIC. A DIC panel was added. Abx given. Hospitalist consulted for admission.  Will add on Lactate and procalcitonin at their request.       Donnamarie Poag, MD 03/30/12 1558

## 2012-03-30 NOTE — ED Notes (Signed)
MD at bedside.hospitist at bedside

## 2012-03-30 NOTE — ED Notes (Signed)
RN unavailable for report at this time.

## 2012-03-30 NOTE — ED Notes (Signed)
Urine sample collected if needed. 

## 2012-03-30 NOTE — ED Notes (Signed)
Pt. Is in great need of a breast pump. Breast continually filling while in ED. Called [phone] to borrow pump. All pumps were in use[facility] [place] [facility] for [facility], sent security to pick up. Pump must be returned by end of shift or patient must rent.

## 2012-03-30 NOTE — ED Notes (Signed)
MD at bedside. 

## 2012-03-31 DIAGNOSIS — D62 Acute posthemorrhagic anemia: Secondary | ICD-10-CM | POA: Diagnosis present

## 2012-03-31 DIAGNOSIS — R112 Nausea with vomiting, unspecified: Secondary | ICD-10-CM

## 2012-03-31 DIAGNOSIS — J810 Acute pulmonary edema: Secondary | ICD-10-CM

## 2012-03-31 DIAGNOSIS — I10 Essential (primary) hypertension: Secondary | ICD-10-CM

## 2012-03-31 DIAGNOSIS — I369 Nonrheumatic tricuspid valve disorder, unspecified: Secondary | ICD-10-CM

## 2012-03-31 LAB — CBC
HCT: 21.7 % — ABNORMAL LOW (ref 36.0–46.0)
MCHC: 32.7 g/dL (ref 30.0–36.0)
MCV: 77.8 fL — ABNORMAL LOW (ref 78.0–100.0)
Platelets: 434 10*3/uL — ABNORMAL HIGH (ref 150–400)
RDW: 15.5 % (ref 11.5–15.5)

## 2012-03-31 LAB — COMPREHENSIVE METABOLIC PANEL
AST: 30 U/L (ref 0–37)
Albumin: 2.5 g/dL — ABNORMAL LOW (ref 3.5–5.2)
BUN: 9 mg/dL (ref 6–23)
Creatinine, Ser: 0.9 mg/dL (ref 0.50–1.10)
Potassium: 3.9 mEq/L (ref 3.5–5.1)
Total Protein: 6.1 g/dL (ref 6.0–8.3)

## 2012-03-31 LAB — CARDIAC PANEL(CRET KIN+CKTOT+MB+TROPI)
CK, MB: 3.2 ng/mL (ref 0.3–4.0)
Relative Index: 1.9 (ref 0.0–2.5)
Total CK: 170 U/L (ref 7–177)

## 2012-03-31 LAB — IRON AND TIBC
Iron: 40 ug/dL — ABNORMAL LOW (ref 42–135)
Saturation Ratios: 9 % — ABNORMAL LOW (ref 20–55)
TIBC: 463 ug/dL (ref 250–470)

## 2012-03-31 LAB — PROTIME-INR
INR: 1.03 (ref 0.00–1.49)
Prothrombin Time: 13.7 seconds (ref 11.6–15.2)

## 2012-03-31 LAB — FOLATE: Folate: 15.6 ng/mL

## 2012-03-31 LAB — VITAMIN B12: Vitamin B-12: 652 pg/mL (ref 211–911)

## 2012-03-31 LAB — ANA: Anti Nuclear Antibody(ANA): NEGATIVE

## 2012-03-31 MED ORDER — AMLODIPINE BESYLATE 10 MG PO TABS: 10.0000 mg | ORAL_TABLET | Freq: Every day | ORAL | Status: DC

## 2012-03-31 MED ORDER — TRIAMTERENE-HCTZ 37.5-25 MG PO TABS: 1.0000 | ORAL_TABLET | Freq: Every day | ORAL | Status: AC

## 2012-03-31 MED ORDER — TRIAMTERENE-HCTZ 37.5-25 MG PO TABS
1.0000 | ORAL_TABLET | Freq: Every day | ORAL | Status: DC
  Administered 2012-03-31: 1 via ORAL
  Filled 2012-03-31: qty 1

## 2012-03-31 MED ORDER — NEBIVOLOL HCL 10 MG PO TABS: 10.0000 mg | ORAL_TABLET | Freq: Every day | ORAL | Status: AC

## 2012-03-31 MED ORDER — AMLODIPINE BESYLATE 10 MG PO TABS
10.0000 mg | ORAL_TABLET | Freq: Every day | ORAL | Status: DC
  Administered 2012-03-31: 10 mg via ORAL
  Filled 2012-03-31: qty 1

## 2012-03-31 NOTE — Progress Notes (Signed)
Utilization Review Completed.Jeanette Brown T5/12/2011   

## 2012-03-31 NOTE — Discharge Summary (Signed)
Patient ID: Jeanette Brown MRN: [id number] DOB/AGE: 1976-04-04 36 y.o. Primary Care Physician:DEAN, ERIC, MD, MD Admit date: 03/30/2012 Discharge date: 03/31/2012    Discharge Diagnoses:   Principal Problem:  *Pulmonary edema Active Problems:  Status post cesarean section  HTN (hypertension)  Pre-eclampsia  Anemia due to blood loss, acute  Hypertensive crisis   Medication List  As of 04/02/2012  8:37 AM   START taking these medications         amLODipine 10 MG tablet   Commonly known as: NORVASC   Take 1 tablet (10 mg total) by mouth daily.      nebivolol 10 MG tablet   Commonly known as: BYSTOLIC   Take 1 tablet (10 mg total) by mouth daily.      triamterene-hydrochlorothiazide 37.5-25 MG per tablet   Commonly known as: MAXZIDE-25   Take 1 each (1 tablet total) by mouth daily.         CONTINUE taking these medications         ferrous sulfate 325 (65 FE) MG tablet   Take 1 tablet (325 mg total) by mouth 3 (three) times daily with meals.      fluticasone 50 MCG/ACT nasal spray   Commonly known as: FLONASE      HYDROcodone-acetaminophen 5-325 MG per tablet   Commonly known as: NORCO   Take 1 tablet by mouth every 4 (four) hours as needed for pain.      prenatal multivitamin Tabs         STOP taking these medications         ibuprofen 600 MG tablet      labetalol 300 MG tablet      methyldopa 250 MG tablet          Where to get your medications    These are the prescriptions that you need to pick up.   You may get these medications from any pharmacy.         amLODipine 10 MG tablet   nebivolol 10 MG tablet   triamterene-hydrochlorothiazide 37.5-25 MG per tablet            Discharged Condition: fair    Consults:none  Significant Diagnostic Studies: Dg Chest 2 View  03/30/2012  *RADIOLOGY REPORT*  Clinical Data: Cough and shortness of breath.  CHEST - 2 VIEW  Comparison: Chest x-ray 01/23/2010.  Findings: Lung volumes are normal.  There  appears to be some mild diffuse bronchial wall thickening.  No consolidative airspace disease.  No pleural effusions.  Pulmonary vasculature and the cardiomediastinal silhouette are within normal limits.  IMPRESSION:  1.  Mild diffuse bronchial wall thickening.  This can be seen in the setting of mild bronchitis or reactive airway disease. Clinical correlation is recommended.  Original Report Authenticated By: Florencia Reasons, M.D.   Ct Angio Chest W/cm &/or Wo Cm  03/30/2012  *RADIOLOGY REPORT*  Clinical Data: Shortness of breath.  Cough.  Recent delivery via C- section.  CT ANGIOGRAPHY CHEST  Technique:  Multidetector CT imaging of the chest using the standard protocol during bolus administration of intravenous contrast. Multiplanar reconstructed images including MIPs were obtained and reviewed to evaluate the vascular anatomy.  Contrast: OMNIPAQUE IOHEXOL 350 MG/ML SOLN  Comparison: No priors.  Findings:  Mediastinum: There are no filling defects within the pulmonary arterial tree to suggest underlying pulmonary embolism. Heart size is borderline enlarged. There is no significant pericardial fluid, thickening or pericardial calcification. There is a small hiatal hernia.  No pathologically enlarged mediastinal or hilar No acute abnormality of the thoracic aorta; specifically, no aneurysm or dissection.  lymph nodes.  Lungs/Pleura: There is extensive multifocal peribronchovascular ground-glass attenuation and ground-glass attenuation nodularity throughout the lungs bilaterally.  This has a slight upper lung predominance.  Small bilateral pleural effusions are layering dependently.  Upper Abdomen: Unremarkable.  Musculoskeletal: There are no aggressive appearing lytic or blastic lesions noted in the visualized portions of the skeleton.  IMPRESSION: 1.  No evidence of pulmonary embolism. 2.  However, there is extensive multifocal peribronchovascular ground-glass attenuation airspace disease and  ground-glass attenuation nodularity throughout the lungs bilaterally with a slight upper lung predominance.  The appearance is suggestive of a multilobar bronchopneumonia.  Given the upper lung predominance and none random distribution (peribronchovascular distribution) sequela of septic emboli is felt less likely. Given the recent delivery, one other differential that warrants consideration may be amniotic fluid embolus, which can present with a similar radiographic appearance and symptoms of disseminated intravascular coagulation. Clinical correlation is recommended. 3.  Small bilateral pleural effusions.  These results were called by telephone on 03/30/2012  at  03:05 p.m. to  Dr.Plunkett, who verbally acknowledged these results.  Original Report Authenticated By: Florencia Reasons, M.D.    Lab Results: No results found for this or any previous visit (from the past 48 hour(s)). Recent Results (from the past 240 hour(s))  MRSA PCR SCREENING     Status: Normal   Collection Time   03/26/12  6:00 AM      Component Value Range Status Comment   MRSA by PCR NEGATIVE  NEGATIVE  Final   CULTURE, BLOOD (ROUTINE X 2)     Status: Normal (Preliminary result)   Collection Time   03/30/12  5:21 PM      Component Value Range Status Comment   Specimen Description BLOOD ARM RIGHT   Final    Special Requests BOTTLES DRAWN AEROBIC AND ANAEROBIC 10CC   Final    Culture  Setup Time [id number]   Final    Culture     Final    Value:        BLOOD CULTURE RECEIVED NO GROWTH TO DATE CULTURE WILL BE HELD FOR 5 DAYS BEFORE ISSUING A FINAL NEGATIVE REPORT   Report Status PENDING   Incomplete   CULTURE, BLOOD (ROUTINE X 2)     Status: Normal (Preliminary result)   Collection Time   03/30/12  5:22 PM      Component Value Range Status Comment   Specimen Description BLOOD HAND LEFT   Final    Special Requests BOTTLES DRAWN AEROBIC AND ANAEROBIC 10CC   Final    Culture  Setup Time [id number]   Final    Culture      Final    Value:        BLOOD CULTURE RECEIVED NO GROWTH TO DATE CULTURE WILL BE HELD FOR 5 DAYS BEFORE ISSUING A FINAL NEGATIVE REPORT   Report Status PENDING   Incomplete      Hospital Course:  36 [facility] [place] for increase dyspnea and tachycardia. Initially thought she may have HCAP or amniotic fluid embolism. After 24 hrs in stepdown unit  became clear she was having pulmonary edema from severe uncontrolled HTN. Patient felt better by HOD no 2  And requested to be DC ed home We resumed her antihypertensive medications that she was taking prior to become pregnant and  educated the patient about low salt diet and importance to f/u with PCP.    Discharge Exam: Blood pressure 158/95, pulse 104, temperature 98.6 F (37 C), temperature source Axillary, resp. rate 22, height 5\' 4"  (1.626 m), weight 109.6 kg (241 lb 10 oz), SpO2 95.00%, unknown if currently breastfeeding. Alert and oriented x3 CVS: RRR, tachycardic S4 gallop  RS: CTAB  Disposition: home DC time 45 minutes   Discharge Orders    Future Orders Please Complete By Expires   Diet - low sodium heart healthy      Increase activity slowly         Follow-up Information    Follow up with August Saucer, ERIC, MD. (as prior scheduled)    Contact information:   [address], [address] [facility] [place] [address] [phone]          Signed: Lonia Blood 04/02/2012, 8:37 AM

## 2012-03-31 NOTE — Progress Notes (Signed)
Discharge instructions given to patient and father.  Both verbalized understanding with all questions answered.  Pt discharged home with father.  Roselie Awkward, RN

## 2012-03-31 NOTE — Progress Notes (Signed)
  Echocardiogram 2D Echocardiogram has been performed.  Mercy Moore 03/31/2012, 10:56 AM

## 2012-03-31 NOTE — Progress Notes (Signed)
Breasts sore, ice pack placed per pt request. Pt does not want to breast pump

## 2012-04-01 NOTE — ED Provider Notes (Signed)
I saw and evaluated the patient, reviewed the resident's note and I agree with the findings and plan. I saw the EKG and agree with the resident's interpretation Patient is is recently postpartum and for the last 24 hours has had worsening shortness of breath. On exam and she is tachypneic with speech but is satting normally on room air. She is not tachycardic but does have diffuse edema. Denies a history of help syndrome or preeclampsia. She had hypertension her entire pregnancy but she also has baseline hypertension. Lungs are clear on exam. CT showed diffuse patchy infiltrates. The patient was admitted and treated for her symptoms  Gwyneth Sprout, MD 04/01/12 1539

## 2012-04-02 DIAGNOSIS — J811 Chronic pulmonary edema: Secondary | ICD-10-CM | POA: Diagnosis present

## 2012-04-02 DIAGNOSIS — I169 Hypertensive crisis, unspecified: Secondary | ICD-10-CM | POA: Diagnosis present

## 2012-04-02 LAB — ANCA SCREEN W REFLEX TITER: c-ANCA Screen: NEGATIVE

## 2012-04-06 LAB — CULTURE, BLOOD (ROUTINE X 2)
Culture  Setup Time: 201305010122
Culture: NO GROWTH

## 2012-05-10 ENCOUNTER — Other Ambulatory Visit: Payer: Self-pay | Admitting: Obstetrics and Gynecology

## 2012-09-25 ENCOUNTER — Encounter (HOSPITAL_COMMUNITY): Payer: Self-pay | Admitting: Pharmacist

## 2012-10-06 ENCOUNTER — Other Ambulatory Visit (HOSPITAL_COMMUNITY): Payer: 59

## 2012-10-06 ENCOUNTER — Encounter (HOSPITAL_COMMUNITY)
Admission: RE | Admit: 2012-10-06 | Discharge: 2012-10-06 | Disposition: A | Discharge status: Home / Self Care | Payer: 59 | Source: Ambulatory Visit | Attending: Obstetrics and Gynecology | Admitting: Obstetrics and Gynecology

## 2012-10-06 ENCOUNTER — Encounter (HOSPITAL_COMMUNITY): Payer: Self-pay

## 2012-10-06 DIAGNOSIS — Z01818 Encounter for other preprocedural examination: Secondary | ICD-10-CM | POA: Insufficient documentation

## 2012-10-06 DIAGNOSIS — Z01812 Encounter for preprocedural laboratory examination: Secondary | ICD-10-CM | POA: Insufficient documentation

## 2012-10-06 HISTORY — DX: Gastro-esophageal reflux disease without esophagitis: K21.9

## 2012-10-06 LAB — CBC
HCT: 38.6 % (ref 36.0–46.0)
Platelets: 419 10*3/uL — ABNORMAL HIGH (ref 150–400)
RDW: 16.2 % — ABNORMAL HIGH (ref 11.5–15.5)
WBC: 5.7 10*3/uL (ref 4.0–10.5)

## 2012-10-06 LAB — SURGICAL PCR SCREEN
MRSA, PCR: NEGATIVE
Staphylococcus aureus: NEGATIVE

## 2012-10-06 LAB — BASIC METABOLIC PANEL
Chloride: 99 mEq/L (ref 96–112)
GFR calc Af Amer: >90 mL/min (ref >90)
Potassium: 3.1 mEq/L — ABNORMAL LOW (ref 3.5–5.1)

## 2012-10-06 NOTE — Patient Instructions (Addendum)
   Your procedure is scheduled on: Friday November 8th  Enter through the Main Entrance of [facility] at:6am Pick up the phone at the desk and dial [phone] and inform us of your arrival.  Please call this number if you have any problems the morning of surgery: [phone]  Remember: Do not eat or drink anything after midnight on Thursday  Please take your morning blood pressure medications on day of surgery. Please take your pepcid if needed. Sips of water with meds  Do not wear jewelry, make-up, or FINGER nail polish No metal in your hair or on your body. Do not wear lotions, powders, perfumes. You may wear deodorant.  Please use your CHG wash as directed prior to surgery.  Do not shave anywhere for at least 12 hours prior to first CHG shower.  Do not bring valuables to the hospital.    Patients discharged on the day of surgery will not be allowed to drive home.

## 2012-10-06 NOTE — Pre-Procedure Instructions (Signed)
Reviewed EKG 03/30/12 and Echo 03/2012 with Dr Pershing Proud does not need repeat Ekg and is ok for LSD.

## 2012-10-07 NOTE — H&P (Signed)
36 year old G 1 P 0 presents for Laparoscopic BTL. Desires permanent sterilization.  NKDA Medical and surgical history unremarkable  Afebrile Vital signs stable General alert and oriented Lung CTAB Car RRR Abdomen is soft and non tender Pelvic is normal  IMPRESSION: Desires permanent sterilization  PLAN: Laparoscopic BTL Risks of failure reviewed with the patient Consent signed

## 2012-10-07 NOTE — Pre-Procedure Instructions (Addendum)
K+ returned at 3.1-notified Dr Guadlupe Spanish to notify patient's PCP to prescribe supplement. Pt on maxide without K+ supp. Dr Willey Blade office called and waiting call back.  Dr August Saucer returned call at 0820-will call in Rx for  K+ supplement

## 2012-10-08 ENCOUNTER — Encounter (HOSPITAL_COMMUNITY): Admission: RE | Payer: Self-pay | Source: Ambulatory Visit

## 2012-10-08 ENCOUNTER — Ambulatory Visit (HOSPITAL_COMMUNITY): Admission: RE | Admit: 2012-10-08 | Payer: 59 | Source: Ambulatory Visit | Admitting: Obstetrics and Gynecology

## 2012-10-08 SURGERY — LIGATION, FALLOPIAN TUBE, LAPAROSCOPIC
Anesthesia: General | Laterality: Bilateral

## 2012-12-16 ENCOUNTER — Encounter: Payer: Self-pay | Admitting: Hematology

## 2012-12-17 ENCOUNTER — Encounter (HOSPITAL_COMMUNITY): Payer: Self-pay

## 2013-03-15 ENCOUNTER — Ambulatory Visit (INDEPENDENT_AMBULATORY_CARE_PROVIDER_SITE_OTHER): Payer: 59 | Admitting: Family Medicine

## 2013-03-15 VITALS — BP 123/85 | HR 85 | Temp 98.3°F | Resp 17 | Ht 65.0 in | Wt 228.0 lb

## 2013-03-15 DIAGNOSIS — H1013 Acute atopic conjunctivitis, bilateral: Secondary | ICD-10-CM

## 2013-03-15 DIAGNOSIS — I10 Essential (primary) hypertension: Secondary | ICD-10-CM

## 2013-03-15 DIAGNOSIS — H1045 Other chronic allergic conjunctivitis: Secondary | ICD-10-CM

## 2013-03-15 MED ORDER — ALBUTEROL SULFATE HFA 108 (90 BASE) MCG/ACT IN AERS: 2.0000 | INHALATION_SPRAY | RESPIRATORY_TRACT | Status: DC | PRN

## 2013-03-15 MED ORDER — METHYLPREDNISOLONE SODIUM SUCC 125 MG IJ SOLR
125.0000 mg | Freq: Once | INTRAMUSCULAR | Status: AC
  Administered 2013-03-15: 125 mg via INTRAVENOUS

## 2013-03-15 MED ORDER — AZELASTINE HCL 0.05 % OP SOLN: 2.0000 [drp] | Freq: Two times a day (BID) | OPHTHALMIC | Status: DC

## 2013-03-15 MED ORDER — PREDNISONE 20 MG PO TABS: ORAL_TABLET | ORAL | Status: DC

## 2013-03-15 MED ORDER — CARBOXYMETHYLCELL-HYPROMELLOSE 0.25-0.3 % OP GEL: 1.0000 | OPHTHALMIC | Status: DC | PRN

## 2013-03-15 MED ORDER — PREDNISONE 10 MG PO TABS: 10.0000 mg | ORAL_TABLET | Freq: Every day | ORAL | Status: DC

## 2013-03-15 NOTE — Patient Instructions (Signed)
Allergic Conjunctivitis The conjunctiva is a thin membrane that covers the visible white part of the eyeball and the underside of the eyelids. This membrane protects and lubricates the eye. The membrane has small blood vessels running through it that can normally be seen. When the conjunctiva becomes inflamed, the condition is called conjunctivitis. In response to the inflammation, the conjunctival blood vessels become swollen. The swelling results in redness in the normally white part of the eye. The blood vessels of this membrane also react when a person has allergies and is then called allergic conjunctivitis. This condition usually lasts for as long as the allergy persists. Allergic conjunctivitis cannot be passed to another person (non-contagious). The likelihood of bacterial infection is great and the cause is not likely due to allergies if the inflamed eye has:  A sticky discharge.  Discharge or sticking together of the lids in the morning.  Scaling or flaking of the eyelids where the eyelashes come out.  Red swollen eyelids. CAUSES   Viruses.  Irritants such as foreign bodies.  Chemicals.  General allergic reactions.  Inflammation or serious diseases in the inside or the outside of the eye or the orbit (the boney cavity in which the eye sits) can cause a "red eye." SYMPTOMS   Eye redness.  Tearing.  Itchy eyes.  Burning feeling in the eyes.  Clear drainage from the eye.  Allergic reaction due to pollens or ragweed sensitivity. Seasonal allergic conjunctivitis is frequent in the spring when pollens are in the air and in the fall. DIAGNOSIS  This condition, in its many forms, is usually diagnosed based on the history and an ophthalmological exam. It usually involves both eyes. If your eyes react at the same time every year, allergies may be the cause. While most "red eyes" are due to allergy or an infection, the role of an eye (ophthalmological) exam is important. The exam  can rule out serious diseases of the eye or orbit. TREATMENT   Non-antibiotic eye drops, ointments, or medications by mouth may be prescribed if the ophthalmologist is sure the conjunctivitis is due to allergies alone.  Over-the-counter drops and ointments for allergic symptoms should be used only after other causes of conjunctivitis have been ruled out, or as your caregiver suggests. Medications by mouth are often prescribed if other allergy-related symptoms are present. If the ophthalmologist is sure that the conjunctivitis is due to allergies alone, treatment is normally limited to drops or ointments to reduce itching and burning. HOME CARE INSTRUCTIONS   Wash hands before and after applying drops or ointments, or touching the inflamed eye(s) or eyelids.  Do not let the eye dropper tip or ointment tube touch the eyelid when putting medicine in your eye.  Stop using your soft contact lenses and throw them away. Use a new pair of lenses when recovery is complete. You should run through sterilizing cycles at least three times before use after complete recovery if the old soft contact lenses are to be used. Hard contact lenses should be stopped. They need to be thoroughly sterilized before use after recovery.  Itching and burning eyes due to allergies is often relieved by using a cool cloth applied to closed eye(s). SEEK MEDICAL CARE IF:   Your problems do not go away after two or three days of treatment.  Your lids are sticky (especially in the morning when you wake up) or stick together.  Discharge develops. Antibiotics may be needed either as drops, ointment, or by mouth.  You   have extreme light sensitivity.  An oral temperature above 102 F (38.9 C) develops.  Pain in or around the eye or any other visual symptom develops. MAKE SURE YOU:   Understand these instructions.  Will watch your condition.  Will get help right away if you are not doing well or get worse. Document  Released: 02/07/2003 Document Revised: 02/09/2012 Document Reviewed: 01/03/2008 Exit[employer] Patient Information 2013 [employer]. Allergic Rhinitis Allergic rhinitis is when the mucous membranes in the nose respond to allergens. Allergens are particles in the air that cause your body to have an allergic reaction. This causes you to release allergic antibodies. Through a chain of events, these eventually cause you to release histamine into the blood stream (hence the use of antihistamines). Although meant to be protective to the body, it is this release that causes your discomfort, such as frequent sneezing, congestion and an itchy runny nose.  CAUSES  The pollen allergens may come from grasses, trees, and weeds. This is seasonal allergic rhinitis, or "hay fever." Other allergens cause year-round allergic rhinitis (perennial allergic rhinitis) such as house dust mite allergen, pet dander and mold spores.  SYMPTOMS   Nasal stuffiness (congestion).  Runny, itchy nose with sneezing and tearing of the eyes.  There is often an itching of the mouth, eyes and ears. It cannot be cured, but it can be controlled with medications. DIAGNOSIS  If you are unable to determine the offending allergen, skin or blood testing may find it. TREATMENT   Avoid the allergen.  Medications and allergy shots (immunotherapy) can help.  Hay fever may often be treated with antihistamines in pill or nasal spray forms. Antihistamines block the effects of histamine. There are over-the-counter medicines that may help with nasal congestion and swelling around the eyes. Check with your caregiver before taking or giving this medicine. If the treatment above does not work, there are many new medications your caregiver can prescribe. Stronger medications may be used if initial measures are ineffective. Desensitizing injections can be used if medications and avoidance fails. Desensitization is when a patient is given ongoing shots  until the body becomes less sensitive to the allergen. Make sure you follow up with your caregiver if problems continue. SEEK MEDICAL CARE IF:   You develop fever (more than 100.5 F (38.1 C).  You develop a cough that does not stop easily (persistent).  You have shortness of breath.  You start wheezing.  Symptoms interfere with normal daily activities. Document Released: 08/12/2001 Document Revised: 02/09/2012 Document Reviewed: 02/21/2009 Exit[employer] Patient Information 2013 [employer].  

## 2013-03-15 NOTE — Progress Notes (Signed)
Subjective:    Patient ID: Jeanette Brown, female    DOB: 1976-07-13, 37 y.o.   MRN: [id number] Chief Complaint  Patient presents with  . Cough  . Nasal Congestion  . Headache  . Allergies    HPI  Having a flair of allergies which she often does yearly.  In the past, she was often on prednisone for a month and then she takes zyrtec year round but now she is taking them 3-4 times a day w/o relief.  Eyes have been hurting from scratching them so much as well as nasal congestion and scratching throat and occ dry cough.  She has the optivar drops at home which work for about 10-15 minutes of relief only so she uses them far to much.  She is using 1 spray in each nare twice a day of flonase and she is doing saline sinus rinses as well but still having really bad sinus HA. No f/c.  No jaw pain.  Sxs keeping her from sleep.   No h/o asthma though she has needed to use albuterol inhalers in the past.  Did use to be on allergy shots in [place] but since she was in [place], doesn't think they could ever find the right mixture for her - would get allergy shots sev times/wk and still have these severe sxs. She has plenty of refills on these chronic meds.  Has been on singulair, claritin, zyrtec, allergra, xyzal in the past with only minimal relief.   Review of Systems  Constitutional: Positive for fatigue. Negative for fever, chills, diaphoresis, activity change and appetite change.  HENT: Positive for ear pain, congestion, sore throat, rhinorrhea, sneezing, postnasal drip and sinus pressure. Negative for nosebleeds, trouble swallowing, neck pain, neck stiffness, voice change and ear discharge.   Eyes: Positive for pain, discharge and itching. Negative for redness and visual disturbance.  Respiratory: Positive for cough. Negative for shortness of breath.   Cardiovascular: Negative for chest pain.  Gastrointestinal: Negative for nausea, vomiting and abdominal pain.  Skin: Negative for rash.  Neurological:  Positive for headaches. Negative for dizziness and syncope.  Hematological: Positive for adenopathy.  Psychiatric/Behavioral: Positive for sleep disturbance.      BP 123/85  Brown 85  Temp(Src) 98.3 F (36.8 C) (Oral)  Resp 17  Ht 5\' 5"  (1.651 m)  Wt 228 lb (103.42 kg)  BMI 37.94 kg/m2  SpO2 97%  LMP 03/14/2013  Breastfeeding? No Objective:   Physical Exam  Constitutional: She is oriented to person, place, and time. She appears well-developed and well-nourished. She appears lethargic. She appears ill. No distress.  HENT:  Head: Normocephalic and atraumatic.  Right Ear: External ear and ear canal normal. Tympanic membrane is retracted. A middle ear effusion is present.  Left Ear: External ear and ear canal normal. Tympanic membrane is retracted. A middle ear effusion is present.  Nose: Mucosal edema and rhinorrhea present. Right sinus exhibits maxillary sinus tenderness. Left sinus exhibits maxillary sinus tenderness.  Mouth/Throat: Uvula is midline and mucous membranes are normal. Posterior oropharyngeal erythema present. No oropharyngeal exudate, posterior oropharyngeal edema or tonsillar abscesses.  Eyes: Conjunctivae are normal. Right eye exhibits no discharge. Left eye exhibits no discharge. No scleral icterus.  Bilateral lower lid hyperpigmentation - allergic shiners.  Neck: Normal range of motion. Neck supple.  Cardiovascular: Normal rate, regular rhythm, normal heart sounds and intact distal pulses.   Pulmonary/Chest: Effort normal. She has decreased breath sounds. She has wheezes. She has no rhonchi. She has no  rales.  Prolonged expiratory phase  Lymphadenopathy:       Head (right side): Submandibular adenopathy present. No preauricular and no posterior auricular adenopathy present.       Head (left side): Submandibular adenopathy present. No preauricular and no posterior auricular adenopathy present.    She has no cervical adenopathy.       Right: No supraclavicular  adenopathy present.       Left: No supraclavicular adenopathy present.  Neurological: She is oriented to person, place, and time. She appears lethargic.  Skin: Skin is warm and dry. She is not diaphoretic. No erythema.  Psychiatric: She has a normal mood and affect. Her behavior is normal.     peak flow 450, 600, 600 Assessment & Plan:  HTN (hypertension)  Allergic conjunctivitis, bilateral - Plan: methylPREDNISolone sodium succinate (SOLU-MEDROL) 125 mg/2 mL injection 125 mg. Solumedrol in office followed by prednisone taper.  No signs of infection but allergy sxs are quite impressive though no signs of bacterial infection so agree w/ temporary prednisone maintenance after taper since she has maximized other allergy meds - pt aware of side effects of prolonged steroid use.  Meds ordered this encounter  Medications  . albuterol (PROVENTIL HFA;VENTOLIN HFA) 108 (90 BASE) MCG/ACT inhaler    Sig: Inhale 2 puffs into the lungs every 4 (four) hours as needed for wheezing (cough, shortness of breath or wheezing.).    Dispense:  1 Inhaler    Refill:  1  . predniSONE (DELTASONE) 20 MG tablet    Sig: Use 3 tabs daily x 2d, then 2 tabs daily x 3d, then 1 tab daily x 3d    Dispense:  15 tablet    Refill:  0  . predniSONE (DELTASONE) 10 MG tablet    Sig: Take 1 tablet (10 mg total) by mouth daily.    Dispense:  30 tablet    Refill:  0  . azelastine (OPTIVAR) 0.05 % ophthalmic solution    Sig: Place 2 drops into both eyes 2 (two) times daily.    Dispense:  6 mL    Refill:  12  . Carboxymethylcell-Hypromellose (GENTEAL) 0.25-0.3 % GEL    Sig: Apply 1 strip to eye every 4 (four) hours as needed.    Dispense:  1 Bottle    Refill:  11  . methylPREDNISolone sodium succinate (SOLU-MEDROL) 125 mg/2 mL injection 125 mg    Sig:

## 2013-03-25 ENCOUNTER — Other Ambulatory Visit: Payer: Self-pay | Admitting: Internal Medicine

## 2013-03-31 ENCOUNTER — Ambulatory Visit
Admission: RE | Admit: 2013-03-31 | Discharge: 2013-03-31 | Disposition: A | Discharge status: Home / Self Care | Payer: 59 | Source: Ambulatory Visit | Attending: Internal Medicine | Admitting: Internal Medicine

## 2013-12-08 ENCOUNTER — Ambulatory Visit (INDEPENDENT_AMBULATORY_CARE_PROVIDER_SITE_OTHER): Payer: 59 | Admitting: Family Medicine

## 2013-12-08 VITALS — BP 124/82 | HR 90 | Temp 98.7°F | Resp 17 | Ht 64.5 in | Wt 215.0 lb

## 2013-12-08 DIAGNOSIS — R5381 Other malaise: Secondary | ICD-10-CM

## 2013-12-08 DIAGNOSIS — R52 Pain, unspecified: Secondary | ICD-10-CM

## 2013-12-08 DIAGNOSIS — J029 Acute pharyngitis, unspecified: Secondary | ICD-10-CM

## 2013-12-08 DIAGNOSIS — R5383 Other fatigue: Secondary | ICD-10-CM

## 2013-12-08 LAB — POCT INFLUENZA A/B
Influenza A, POC: NEGATIVE
Influenza B, POC: NEGATIVE

## 2013-12-08 LAB — POCT RAPID STREP A (OFFICE): Rapid Strep A Screen: NEGATIVE

## 2013-12-08 MED ORDER — FIRST-DUKES MOUTHWASH MT SUSP: OROMUCOSAL | Status: DC

## 2013-12-08 NOTE — Patient Instructions (Signed)
Use the "magic mouthwash" as needed for sore throat.  Also, try using ibuprofen up to 600- 800 mg every 8 hours as needed for sore glands.  If you are not feeling better in the next few days please let me know- Sooner if worse.

## 2013-12-08 NOTE — Progress Notes (Signed)
[facility] [address], [place] [id number] [phone]  Date:  12/08/2013   Name:  Jeanette PulseWillisha R Brown   DOB:  [birth year]   MRN:  [id number]  PCP:  Willey BladeEAN, ERIC, MD    Chief Complaint: Cough, Sore Throat, Laryngitis, Neck Pain and Generalized Body Aches   History of Present Illness:  Jeanette PulseWillisha R Brown is a 38 y.o. very pleasant female patient who presents with the following:  Here today with illness for 2 days. She noted an "itchy throat," then ST, neck glands are painful. Dry cough, headache.  She is not sure if she might have had a fever.  She has felt very tired, she has body aches but not chills.   She does not have a runny or stuffy nose, no sneezing No GI symptoms  She has an inhaler but has not needed to use it.   She has a hard time swallowing due to pain    She had some complications with her pregnancy last year but she is now better, her son is doing well.   She is on norvasc for her BP.   LMP 11/09/13.  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   Patient Active Problem List   Diagnosis Date Noted  . Pulmonary edema 04/02/2012  . Hypertensive crisis 04/02/2012  . Anemia due to blood loss, acute 03/31/2012  . HTN (hypertension) 03/30/2012  . Pre-eclampsia 03/30/2012  . Fetal bradycardia affecting management of mother, delivered 03/26/2012  . Status post cesarean section 03/26/2012    Past Medical History  Diagnosis Date  . 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 Allergic rhinitis due to pollen   . Neutropenia, unspecified   . Obesity, unspecified   . GERD (gastroesophageal reflux disease)     otc pepcid  . Hypertension     managed by pcp-Dr Willey BladeEric Dean    Past Surgical History  Procedure Laterality Date  . Cesarean section  03/26/2012    Procedure: CESAREAN SECTION;  Surgeon: Leslie AndreaJames E Tomblin II, MD;  Location: [facility];  Service:  Gynecology;  Laterality: N/A;  Primary Cesarean Section with birth of baby boy @ [id number] Apgars 8/10  . Wisdom tooth extraction      History  Substance Use Topics  . Smoking status: Never Smoker   . Smokeless tobacco: Never Used  . Alcohol Use: No    Family History  Problem Relation Age of Onset  . Hypertension Mother   . Hypertension Father   . Heart disease Father     No Known Allergies  Medication list has been reviewed and updated.  Current Outpatient Prescriptions on File Prior to Visit  Medication Sig Dispense Refill  . amLODipine (NORVASC) 10 MG tablet Take 10 mg by mouth daily.      Marland Kitchen. albuterol (PROVENTIL HFA;VENTOLIN HFA) 108 (90 BASE) MCG/ACT inhaler Inhale 2 puffs into the lungs every 4 (four) hours as needed for wheezing (cough, shortness of breath or wheezing.).  1 Inhaler  1   No current facility-administered medications on file prior to visit.    Review of Systems:  As per HPI- otherwise negative.   Physical Examination: Filed Vitals:   12/08/13 1057  BP: 124/82  Brown: 98  Temp: 98.7 F (37.1 C)  Resp: 17   Filed Vitals:   12/08/13 1057  Height: 5' 4.5" (1.638 m)  Weight:  215 lb (97.523 kg)   Body mass index is 36.35 kg/(m^2). Ideal Body Weight: Weight in (lb) to have BMI = 25: 147.6  GEN: WDWN, NAD, Non-toxic, A & O x 3, overweight, looks well HEENT: Atraumatic, Normocephalic. Neck supple. No masses, No LAD.  Bilateral TM wnl, oropharynx injected but no exudate or swelling.  PEERL,EOMI.  Nasal cavity inflamed  Ears and Nose: No external deformity. CV: RRR, No M/G/R. No JVD. No thrill. No extra heart sounds. PULM: CTA B, no wheezes, crackles, rhonchi. No retractions. No resp. distress. No accessory muscle use. ABD: S, NT, ND EXTR: No c/c/e NEURO Normal gait.  PSYCH: Normally interactive. Conversant. Not depressed or anxious appearing.  Calm demeanor.   Results for orders placed in visit on 12/08/13  POCT INFLUENZA A/B      Result Value  Range   Influenza A, POC Negative     Influenza B, POC Negative    POCT RAPID STREP A (OFFICE)      Result Value Range   Rapid Strep A Screen Negative  Negative    Assessment and Plan: Acute pharyngitis - Plan: POCT rapid strep A, Throat culture ([facility]), Diphenhyd-Hydrocort-Nystatin (FIRST-DUKES MOUTHWASH) SUSP  Other malaise and fatigue - Plan: POCT Influenza A/B  Body aches  Likely viral URI and pharyngitis.  DMM for use prn.  May continue OTC medications as needed follow-up if not better in the next few days- Sooner if worse.   OOW note give  Signed Abbe Amsterdam, MD

## 2013-12-10 ENCOUNTER — Ambulatory Visit (INDEPENDENT_AMBULATORY_CARE_PROVIDER_SITE_OTHER): Payer: 59 | Admitting: Emergency Medicine

## 2013-12-10 VITALS — BP 142/90 | HR 90 | Temp 98.8°F | Resp 18 | Ht 65.0 in | Wt 214.0 lb

## 2013-12-10 DIAGNOSIS — J111 Influenza due to unidentified influenza virus with other respiratory manifestations: Secondary | ICD-10-CM

## 2013-12-10 DIAGNOSIS — R69 Illness, unspecified: Principal | ICD-10-CM

## 2013-12-10 LAB — CULTURE, GROUP A STREP: ORGANISM ID, BACTERIA: NORMAL

## 2013-12-10 MED ORDER — PSEUDOEPHEDRINE-GUAIFENESIN ER 60-600 MG PO TB12: 1.0000 | ORAL_TABLET | Freq: Two times a day (BID) | ORAL | Status: DC

## 2013-12-10 MED ORDER — PROMETHAZINE-CODEINE 6.25-10 MG/5ML PO SYRP: 5.0000 mL | ORAL_SOLUTION | Freq: Four times a day (QID) | ORAL | Status: DC | PRN

## 2013-12-10 MED ORDER — OSELTAMIVIR PHOSPHATE 75 MG PO CAPS: 75.0000 mg | ORAL_CAPSULE | Freq: Two times a day (BID) | ORAL | Status: DC

## 2013-12-10 NOTE — Progress Notes (Signed)
[facility] [address] [place] [address] [phone]  Date:  12/10/2013   Name:  Jeanette Brown   DOB:  Aug 18, 1976   MRN:  [id number]  PCP:  Jeanette Brown    Chief Complaint: Follow-up   History of Present Illness:  Jeanette Brown is a 38 y.o. very pleasant female patient who presents with the following:  Ill since Thursday she says with a cough, fever, and runny nose.  Seen on Thursday and had a negative flu test.  Has malaise, fatigue and myalgias and a sore throat.  No wheezing or shortness of breath.  No nausea or vomiting.  No improvement with meds. No improvement with over the counter medications or other home remedies. Denies other complaint or health concern today.   Patient Active Problem List   Diagnosis Date Noted  . Pulmonary edema 04/02/2012  . Hypertensive crisis 04/02/2012  . Anemia due to blood loss, acute 03/31/2012  . HTN (hypertension) 03/30/2012  . Pre-eclampsia 03/30/2012  . Fetal bradycardia affecting management of mother, delivered 03/26/2012  . Status post cesarean section 03/26/2012    Past Medical History  Diagnosis Date  . 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 Allergic rhinitis due to pollen   . Neutropenia, unspecified   . Obesity, unspecified   . GERD (gastroesophageal reflux disease)     otc pepcid  . Hypertension     managed by pcp-Dr Jeanette Blade    Past Surgical History  Procedure Laterality Date  . Cesarean section  03/26/2012    Procedure: CESAREAN SECTION;  Surgeon: Jeanette Brown;  Location: [facility];  Service: Gynecology;  Laterality: N/A;  Primary Cesarean Section with birth of baby boy @ 66 Apgars 8/10  . Wisdom tooth extraction      History  Substance Use Topics  . Smoking status: Never Smoker   . Smokeless tobacco: Never Used  . Alcohol Use: No    Family History  Problem Relation Age of Onset  . Hypertension Mother   . Hypertension Father   . Heart  disease Father     No Known Allergies  Medication list has been reviewed and updated.  Current Outpatient Prescriptions on File Prior to Visit  Medication Sig Dispense Refill  . albuterol (PROVENTIL HFA;VENTOLIN HFA) 108 (90 BASE) MCG/ACT inhaler Inhale 2 puffs into the lungs every 4 (four) hours as needed for wheezing (cough, shortness of breath or wheezing.).  1 Inhaler  1  . amLODipine (NORVASC) 10 MG tablet Take 10 mg by mouth daily.      . Diphenhyd-Hydrocort-Nystatin (FIRST-DUKES MOUTHWASH) SUSP Max 1:1 with viscous lidocaine.  Swish, gargle and spit with one teaspoon as needed  120 mL  0   No current facility-administered medications on file prior to visit.    Review of Systems:  As per HPI, otherwise negative.    Physical Examination: Filed Vitals:   12/10/13 0910  BP: 142/90  Pulse: 90  Temp: 98.8 F (37.1 C)  Resp: 18   Filed Vitals:   12/10/13 0910  Height: 5\' 5"  (1.651 m)  Weight: 214 lb (97.07 kg)   Body mass index is 35.61 kg/(m^2). Ideal Body Weight: Weight in (lb) to have BMI = 25: 149.9  GEN: WDWN, ill appearing with persistent cough, Non-toxic, A & O x 3 HEENT: Atraumatic, Normocephalic. Neck supple. No masses, No LAD. Ears and Nose: No external deformity. CV: RRR, No M/G/R. No JVD. No  thrill. No extra heart sounds. PULM: CTA B, no wheezes, crackles, rhonchi. No retractions. No resp. distress. No accessory muscle use. ABD: S, NT, ND, +BS. No rebound. No HSM. EXTR: No c/c/e NEURO Normal gait.  PSYCH: Normally interactive. Conversant. Not depressed or anxious appearing.  Calm demeanor.    Assessment and Plan: Influenza like illness tamiflu Phen c cod  Signed,  Phillips OdorJeffery Marleah Beever, Brown

## 2013-12-10 NOTE — Patient Instructions (Signed)

## 2014-08-09 ENCOUNTER — Other Ambulatory Visit: Payer: Self-pay | Admitting: Obstetrics and Gynecology

## 2014-08-10 LAB — CYTOLOGY - PAP

## 2014-10-30 ENCOUNTER — Other Ambulatory Visit: Payer: Self-pay | Admitting: Internal Medicine

## 2014-10-30 ENCOUNTER — Ambulatory Visit
Admission: RE | Admit: 2014-10-30 | Discharge: 2014-10-30 | Disposition: A | Discharge status: Home / Self Care | Payer: 59 | Source: Ambulatory Visit | Attending: Internal Medicine | Admitting: Internal Medicine

## 2014-10-30 DIAGNOSIS — J40 Bronchitis, not specified as acute or chronic: Secondary | ICD-10-CM

## 2014-10-30 DIAGNOSIS — R0989 Other specified symptoms and signs involving the circulatory and respiratory systems: Secondary | ICD-10-CM

## 2014-12-04 ENCOUNTER — Ambulatory Visit (INDEPENDENT_AMBULATORY_CARE_PROVIDER_SITE_OTHER): Payer: 59 | Admitting: Emergency Medicine

## 2014-12-04 ENCOUNTER — Ambulatory Visit (INDEPENDENT_AMBULATORY_CARE_PROVIDER_SITE_OTHER): Payer: 59

## 2014-12-04 VITALS — BP 124/84 | HR 78 | Temp 99.0°F | Resp 16 | Ht 65.0 in | Wt 220.0 lb

## 2014-12-04 DIAGNOSIS — M25571 Pain in right ankle and joints of right foot: Secondary | ICD-10-CM

## 2014-12-04 DIAGNOSIS — S93401A Sprain of unspecified ligament of right ankle, initial encounter: Secondary | ICD-10-CM

## 2014-12-04 MED ORDER — MELOXICAM 7.5 MG PO TABS: 7.5000 mg | ORAL_TABLET | Freq: Every day | ORAL | Status: DC

## 2014-12-04 NOTE — Progress Notes (Signed)
    MRN: [id number] DOB: February 15, 1976  Subjective:   Jeanette Brown is a 39 y.o. female presenting for 2 day history of twisting her right ankle. Patient was walking on the street, mistepped over a curb and rolled her right ankle, felt immediate pain, was unable to bear weight, developed edema, decreased ROM secondary to pain. For relief Jeanette Brown has been using ice for 30 minutes about every hour,  ibuprofen every 6-8 hours with some relief. Jeanette Brown is now walking with pain in her right ankle, limping, swelling is somewhat improved. Denies bruising, hearing popping noises during injury, changes in sensation. Denies previously injuring her ankle, no surgeries of her foot or ankle. Denies any other aggravating or relieving factors, no other questions or concerns.  Jeanette Brown has a current medication list which includes the following prescription(s): amlodipine, nebivolol, and triamterene-hydrochlorothiazide.  Jeanette Brown has No Known Allergies.  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 Also  has past surgical history that includes Cesarean section (03/26/2012) and Wisdom tooth extraction.  ROS As in subjective.  Objective:   Vitals: BP 124/84 mmHg  Pulse 78  Temp(Src) 99 F (37.2 C) (Oral)  Resp 16  Ht  (1.651 m)  Wt 220 lb (99.791 kg)  BMI 36.61 kg/m2  SpO2 100%  LMP 11/20/2014  Physical Exam  Constitutional: Jeanette Brown is oriented to person, place, and time and well-developed, well-nourished, and in no distress.  Cardiovascular: Normal rate.   Pulmonary/Chest: Effort normal.  Musculoskeletal:  Right ankle edematous laterally and tender over malleolus, mild tenderness over base of 5th metatarsal, decreased ROM secondary to pain. No ecchymosis, crepitus. Posterior Tibial and Dorsalis pulses 2+.    Neurological: Jeanette Brown is alert and oriented to person, place, and time.  Skin: Skin is warm and dry. No rash noted. Jeanette Brown is not diaphoretic. No erythema.  Psychiatric: Mood and affect normal.   [facility] reading (PRIMARY) by  Dr. Dareen Piano and PA-Averianna Brugger. Right ankle X-ray: bony landmarks within normal limits, normal alignment without evidence of fracture or dislocation. Preserved joint spaces. Soft tissue without abnormalities.  Assessment and Plan :   1. Ankle pain, right 2. Right ankle sprain, initial encounter - Ankle XR normal, use ankle brace, elevate leg, meloxicam for pain, sports rehab as instructed - Return to clinic if symptoms worsen, fail to resolve or as needed  Wallis Bamberg, PA-C [facility] [phone] 12/04/2014 5:05 PM

## 2014-12-04 NOTE — Patient Instructions (Signed)
Please perform exercises below, 5 sets of 10 repetitions for each. Elevate your leg while at work. You may use Mobic 7.5mg  once daily up to  (2 tablets) per day.   Acute Ankle Sprain with Phase I Rehab An acute ankle sprain is a partial or complete tear in one or more of the ligaments of the ankle due to traumatic injury. The severity of the injury depends on both the number of ligaments sprained and the grade of sprain. There are 3 grades of sprains.   A grade 1 sprain is a mild sprain. There is a slight pull without obvious tearing. There is no loss of strength, and the muscle and ligament are the correct length.  A grade 2 sprain is a moderate sprain. There is tearing of fibers within the substance of the ligament where it connects two bones or two cartilages. The length of the ligament is increased, and there is usually decreased strength.  A grade 3 sprain is a complete rupture of the ligament and is uncommon. In addition to the grade of sprain, there are three types of ankle sprains.  Lateral ankle sprains: This is a sprain of one or more of the three ligaments on the outer side (lateral) of the ankle. These are the most common sprains. Medial ankle sprains: There is one large triangular ligament of the inner side (medial) of the ankle that is susceptible to injury. Medial ankle sprains are less common. Syndesmosis, "high ankle," sprains: The syndesmosis is the ligament that connects the two bones of the lower leg. Syndesmosis sprains usually only occur with very severe ankle sprains. SYMPTOMS  Pain, tenderness, and swelling in the ankle, starting at the side of injury that may progress to the whole ankle and foot with time.  "Pop" or tearing sensation at the time of injury.  Bruising that may spread to the heel.  Impaired ability to walk soon after injury. CAUSES   Acute ankle sprains are caused by trauma placed on the ankle that temporarily forces or pries the anklebone (talus)  out of its normal socket.  Stretching or tearing of the ligaments that normally hold the joint in place (usually due to a twisting injury). RISK INCREASES WITH:  Previous ankle sprain.  Sports in which the foot may land awkwardly (i.e., basketball, volleyball, or soccer) or walking or running on uneven or rough surfaces.  Shoes with inadequate support to prevent sideways motion when stress occurs.  Poor strength and flexibility.  Poor balance skills.  Contact sports. PREVENTION   Warm up and stretch properly before activity.  Maintain physical fitness:  Ankle and leg flexibility, muscle strength, and endurance.  Cardiovascular fitness.  Balance training activities.  Use proper technique and have a coach correct improper technique.  Taping, protective strapping, bracing, or high-top tennis shoes may help prevent injury. Initially, tape is best; however, it loses most of its support function within 10 to 15 minutes.  Wear proper-fitted protective shoes (High-top shoes with taping or bracing is more effective than either alone).  Provide the ankle with support during sports and practice activities for 12 months following injury. PROGNOSIS   If treated properly, ankle sprains can be expected to recover completely; however, the length of recovery depends on the degree of injury.  A grade 1 sprain usually heals enough in 5 to 7 days to allow modified activity and requires an average of 6 weeks to heal completely.  A grade 2 sprain requires 6 to 10 weeks to heal completely.  A grade 3 sprain requires 12 to 16 weeks to heal.  A syndesmosis sprain often takes more than 3 months to heal. RELATED COMPLICATIONS   Frequent recurrence of symptoms may result in a chronic problem. Appropriately addressing the problem the first time decreases the frequency of recurrence and optimizes healing time. Severity of the initial sprain does not predict the likelihood of later  instability.  Injury to other structures (bone, cartilage, or tendon).  A chronically unstable or arthritic ankle joint is a possibility with repeated sprains. TREATMENT Treatment initially involves the use of ice, medication, and compression bandages to help reduce pain and inflammation. Ankle sprains are usually immobilized in a walking cast or boot to allow for healing. Crutches may be recommended to reduce pressure on the injury. After immobilization, strengthening and stretching exercises may be necessary to regain strength and a full range of motion. Surgery is rarely needed to treat ankle sprains. MEDICATION   Nonsteroidal anti-inflammatory medications, such as aspirin and ibuprofen (do not take for the first 3 days after injury or within 7 days before surgery), or other minor pain relievers, such as acetaminophen, are often recommended. Take these as directed by your caregiver. Contact your caregiver immediately if any bleeding, stomach upset, or signs of an allergic reaction occur from these medications.  Ointments applied to the skin may be helpful.  Pain relievers may be prescribed as necessary by your caregiver. Do not take prescription pain medication for longer than 4 to 7 days. Use only as directed and only as much as you need. HEAT AND COLD  Cold treatment (icing) is used to relieve pain and reduce inflammation for acute and chronic cases. Cold should be applied for 10 to 15 minutes every 2 to 3 hours for inflammation and pain and immediately after any activity that aggravates your symptoms. Use ice packs or an ice massage.  Heat treatment may be used before performing stretching and strengthening activities prescribed by your caregiver. Use a heat pack or a warm soak. SEEK IMMEDIATE MEDICAL CARE IF:   Pain, swelling, or bruising worsens despite treatment.  You experience pain, numbness, discoloration, or coldness in the foot or toes.  New, unexplained symptoms develop (drugs  used in treatment may produce side effects.) EXERCISES  PHASE I EXERCISES RANGE OF MOTION (ROM) AND STRETCHING EXERCISES - Ankle Sprain, Acute Phase I, Weeks 1 to 2 These exercises may help you when beginning to restore flexibility in your ankle. You will likely work on these exercises for the 1 to 2 weeks after your injury. Once your physician, physical therapist, or athletic trainer sees adequate progress, he or she will advance your exercises. While completing these exercises, remember:   Restoring tissue flexibility helps normal motion to return to the joints. This allows healthier, less painful movement and activity.  An effective stretch should be held for at least 30 seconds.  A stretch should never be painful. You should only feel a gentle lengthening or release in the stretched tissue. RANGE OF MOTION - Dorsi/Plantar Flexion  While sitting with your right / left knee straight, draw the top of your foot upwards by flexing your ankle. Then reverse the motion, pointing your toes downward.  Hold each position for __________ seconds.  After completing your first set of exercises, repeat this exercise with your knee bent. Repeat __________ times. Complete this exercise __________ times per day.  RANGE OF MOTION - Ankle Alphabet  Imagine your right / left big toe is a pen.  Keeping  your hip and knee still, write out the entire alphabet with your "pen." Make the letters as large as you can without increasing any discomfort. Repeat __________ times. Complete this exercise __________ times per day.  STRENGTHENING EXERCISES - Ankle Sprain, Acute -Phase I, Weeks 1 to 2 These exercises may help you when beginning to restore strength in your ankle. You will likely work on these exercises for 1 to 2 weeks after your injury. Once your physician, physical therapist, or athletic trainer sees adequate progress, he or she will advance your exercises. While completing these exercises, remember:    Muscles can gain both the endurance and the strength needed for everyday activities through controlled exercises.  Complete these exercises as instructed by your physician, physical therapist, or athletic trainer. Progress the resistance and repetitions only as guided.  You may experience muscle soreness or fatigue, but the pain or discomfort you are trying to eliminate should never worsen during these exercises. If this pain does worsen, stop and make certain you are following the directions exactly. If the pain is still present after adjustments, discontinue the exercise until you can discuss the trouble with your clinician. STRENGTH - Dorsiflexors  Secure a rubber exercise band/tubing to a fixed object (i.e., table, pole) and loop the other end around your right / left foot.  Sit on the floor facing the fixed object. The band/tubing should be slightly tense when your foot is relaxed.  Slowly draw your foot back toward you using your ankle and toes.  Hold this position for __________ seconds. Slowly release the tension in the band and return your foot to the starting position. Repeat __________ times. Complete this exercise __________ times per day.  STRENGTH - Plantar-flexors   Sit with your right / left leg extended. Holding onto both ends of a rubber exercise band/tubing, loop it around the ball of your foot. Keep a slight tension in the band.  Slowly push your toes away from you, pointing them downward.  Hold this position for __________ seconds. Return slowly, controlling the tension in the band/tubing. Repeat __________ times. Complete this exercise __________ times per day.  STRENGTH - Ankle Eversion  Secure one end of a rubber exercise band/tubing to a fixed object (table, pole). Loop the other end around your foot just before your toes.  Place your fists between your knees. This will focus your strengthening at your ankle.  Drawing the band/tubing across your opposite  foot, slowly, pull your little toe out and up. Make sure the band/tubing is positioned to resist the entire motion.  Hold this position for __________ seconds. Have your muscles resist the band/tubing as it slowly pulls your foot back to the starting position.  Repeat __________ times. Complete this exercise __________ times per day.  STRENGTH - Ankle Inversion  Secure one end of a rubber exercise band/tubing to a fixed object (table, pole). Loop the other end around your foot just before your toes.  Place your fists between your knees. This will focus your strengthening at your ankle.  Slowly, pull your big toe up and in, making sure the band/tubing is positioned to resist the entire motion.  Hold this position for __________ seconds.  Have your muscles resist the band/tubing as it slowly pulls your foot back to the starting position. Repeat __________ times. Complete this exercises __________ times per day.  STRENGTH - Towel Curls  Sit in a chair positioned on a non-carpeted surface.  Place your right / left foot on a towel,  keeping your heel on the floor.  Pull the towel toward your heel by only curling your toes. Keep your heel on the floor.  If instructed by your physician, physical therapist, or athletic trainer, add weight to the end of the towel. Repeat __________ times. Complete this exercise __________ times per day. Document Released: 06/18/2005 Document Revised: 04/03/2014 Document Reviewed: 03/01/2009 [facility] Patient Information 2015 [place], [place]. This information is not intended to replace advice given to you by your health care provider. Make sure you discuss any questions you have with your health care provider.

## 2015-03-26 ENCOUNTER — Institutional Professional Consult (permissible substitution): Payer: Self-pay | Admitting: Pulmonary Disease

## 2015-04-04 ENCOUNTER — Emergency Department (HOSPITAL_COMMUNITY)
Admission: EM | Admit: 2015-04-04 | Discharge: 2015-04-04 | Disposition: A | Discharge status: Home / Self Care | Payer: 59 | Attending: Emergency Medicine | Admitting: Emergency Medicine

## 2015-04-04 ENCOUNTER — Encounter (HOSPITAL_COMMUNITY): Payer: Self-pay | Admitting: Emergency Medicine

## 2015-04-04 ENCOUNTER — Emergency Department (HOSPITAL_COMMUNITY): Payer: 59

## 2015-04-04 DIAGNOSIS — Y9289 Other specified places as the place of occurrence of the external cause: Secondary | ICD-10-CM | POA: Insufficient documentation

## 2015-04-04 DIAGNOSIS — Z8709 Personal history of other diseases of the respiratory system: Secondary | ICD-10-CM | POA: Diagnosis not present

## 2015-04-04 DIAGNOSIS — Y9389 Activity, other specified: Secondary | ICD-10-CM | POA: Diagnosis not present

## 2015-04-04 DIAGNOSIS — Y998 Other external cause status: Secondary | ICD-10-CM | POA: Insufficient documentation

## 2015-04-04 DIAGNOSIS — W108XXA Fall (on) (from) other stairs and steps, initial encounter: Secondary | ICD-10-CM | POA: Diagnosis not present

## 2015-04-04 DIAGNOSIS — S8991XA Unspecified injury of right lower leg, initial encounter: Secondary | ICD-10-CM | POA: Diagnosis present

## 2015-04-04 DIAGNOSIS — Z862 Personal history of diseases of the blood and blood-forming organs and certain disorders involving the immune mechanism: Secondary | ICD-10-CM | POA: Diagnosis not present

## 2015-04-04 DIAGNOSIS — R079 Chest pain, unspecified: Secondary | ICD-10-CM

## 2015-04-04 DIAGNOSIS — E669 Obesity, unspecified: Secondary | ICD-10-CM | POA: Insufficient documentation

## 2015-04-04 DIAGNOSIS — I10 Essential (primary) hypertension: Secondary | ICD-10-CM | POA: Diagnosis not present

## 2015-04-04 DIAGNOSIS — S80211A Abrasion, right knee, initial encounter: Secondary | ICD-10-CM | POA: Diagnosis not present

## 2015-04-04 DIAGNOSIS — T07XXXA Unspecified multiple injuries, initial encounter: Secondary | ICD-10-CM

## 2015-04-04 DIAGNOSIS — Z791 Long term (current) use of non-steroidal anti-inflammatories (NSAID): Secondary | ICD-10-CM | POA: Diagnosis not present

## 2015-04-04 DIAGNOSIS — K219 Gastro-esophageal reflux disease without esophagitis: Secondary | ICD-10-CM | POA: Diagnosis not present

## 2015-04-04 DIAGNOSIS — M25561 Pain in right knee: Secondary | ICD-10-CM

## 2015-04-04 DIAGNOSIS — Z79899 Other long term (current) drug therapy: Secondary | ICD-10-CM | POA: Diagnosis not present

## 2015-04-04 DIAGNOSIS — S01512A Laceration without foreign body of oral cavity, initial encounter: Secondary | ICD-10-CM | POA: Diagnosis not present

## 2015-04-04 DIAGNOSIS — W19XXXA Unspecified fall, initial encounter: Secondary | ICD-10-CM

## 2015-04-04 MED ORDER — MORPHINE SULFATE 4 MG/ML IJ SOLN
4.0000 mg | Freq: Once | INTRAMUSCULAR | Status: AC
  Administered 2015-04-04: 4 mg via INTRAMUSCULAR
  Filled 2015-04-04: qty 1

## 2015-04-04 MED ORDER — BACITRACIN 500 UNIT/GM EX OINT
3.0000 "application " | TOPICAL_OINTMENT | Freq: Two times a day (BID) | CUTANEOUS | Status: DC
  Administered 2015-04-04: 3 via TOPICAL
  Filled 2015-04-04 (×2): qty 2.7

## 2015-04-04 MED ORDER — MORPHINE SULFATE 4 MG/ML IJ SOLN: 4.0000 mg | Freq: Once | INTRAMUSCULAR | Status: DC

## 2015-04-04 MED ORDER — BACITRACIN ZINC 500 UNIT/GM EX OINT
TOPICAL_OINTMENT | CUTANEOUS | Status: AC
  Filled 2015-04-04: qty 2.7

## 2015-04-04 MED ORDER — OXYCODONE-ACETAMINOPHEN 5-325 MG PO TABS: ORAL_TABLET | ORAL | Status: DC

## 2015-04-04 NOTE — ED Notes (Signed)
Pt transported to [facility].  

## 2015-04-04 NOTE — ED Provider Notes (Signed)
CSN: [id number]     Arrival date & time 04/04/15  0915 History   First MD Initiated Contact with Patient 04/04/15 [phone]     Chief Complaint  Patient presents with  . Fall  . Knee Pain     (Consider location/radiation/quality/duration/timing/severity/associated sxs/prior Treatment) HPI  Jeanette Brown is a 39 y.o. female complaining of 10 out of 10 right knee pain status post mechanical slip and fall while walking down the stairs of a parking deck this morning. She states that she fell down 4 steps. Has not been ambulatory since the event and states that weightbearing significantly exacerbates her right knee pain. Last tetanus shot is unknown, patient reports abrasion to right knee, upper lip. Pt denies head trauma, LOC, N/V, change in vision, cervicalgia, chest pain, SOB, abdominal pain, numbness, weakness,  EtOH/illicit drug/perscription drug use that would alter awareness.   Past Medical History  Diagnosis Date  . 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 Allergic rhinitis due to pollen   . Neutropenia, unspecified   . Obesity, unspecified   . GERD (gastroesophageal reflux disease)     otc pepcid  . Hypertension     managed by pcp-Dr Willey BladeEric Dean   Past Surgical History  Procedure Laterality Date  . Cesarean section  03/26/2012    Procedure: CESAREAN SECTION;  Surgeon: Leslie AndreaJames E Tomblin II, MD;  Location: [facility];  Service: Gynecology;  Laterality: N/A;  Primary Cesarean Section with birth of baby boy @ [id number] Apgars 8/10  . Wisdom tooth extraction     Family History  Problem Relation Age of Onset  . Hypertension Mother   . Hypertension Father   . Heart disease Father    History  Substance Use Topics  . Smoking status: Never Smoker   . Smokeless tobacco: Never Used  . Alcohol Use: No   OB History    Gravida Para Term Preterm AB TAB SAB Ectopic Multiple Living   4 2 1 1 2 2    2      Review of Systems  10 systems reviewed and found to be negative, except  as noted in the HPI.   Allergies  Review of patient's allergies indicates no known allergies.  Home Medications   Prior to Admission medications   Medication Sig Start Date End Date Taking? Authorizing Provider  amLODipine (NORVASC) 10 MG tablet Take 10 mg by mouth daily.    Historical Provider, MD  meloxicam (MOBIC) 7.5 MG tablet Take 1 tablet (7.5 mg total) by mouth daily. 12/04/14   Wallis BambergMario Mani, PA-C  nebivolol (BYSTOLIC) 10 MG tablet Take 10 mg by mouth daily.    Historical Provider, MD  oxyCODONE-acetaminophen (PERCOCET/ROXICET) 5-325 MG per tablet 1 to 2 tabs PO q6hrs  PRN for pain 04/04/15   Joni ReiningNicole Dencil Cayson, PA-C  triamterene-hydrochlorothiazide (DYAZIDE) 37.5-25 MG per capsule Take 1 capsule by mouth daily.    Historical Provider, MD   BP 107/56 mmHg  Pulse 71  Temp(Src) 97.6 F (36.4 C) (Oral)  Resp 20  SpO2 100%  LMP 03/06/2015 Physical Exam  Constitutional: She is oriented to person, place, and time. She appears well-developed and well-nourished.  HENT:  Head: Normocephalic.  Mouth/Throat: Oropharynx is clear and moist.  Social thickness abrasion to upper lip, patient has proximal a 7 mm partial to full-thickness laceration to oral mucosa of upper lip, non-gaping  No hemotympanum, battle signs or raccoon's eyes  No crepitance or tenderness to palpation along the orbital rim.  EOMI intact  with no pain or diplopia  No abnormal otorrhea or rhinorrhea. Nasal septum midline.  No intraoral trauma.  Eyes: Conjunctivae and EOM are normal. Pupils are equal, round, and reactive to light.  Neck: Normal range of motion. Neck supple.  No midline C-spine  tenderness to palpation or step-offs appreciated. Patient has full range of motion without pain.   Cardiovascular: Normal rate, regular rhythm and intact distal pulses.   Pulmonary/Chest: Effort normal and breath sounds normal. No respiratory distress. She has no wheezes. She has no rales. She exhibits no tenderness.  No TTP or  crepitance  Abdominal: Soft. Bowel sounds are normal. She exhibits no distension and no mass. There is no tenderness. There is no rebound and no guarding.  Musculoskeletal: Normal range of motion. She exhibits tenderness. She exhibits no edema.  Right knee with 3 cm partial-thickness abrasion, no significant effusion, significantly reduced range of motion in both flexion and extension secondary to pain. She is distally neurovascularly intact. Grossly stable to anterior and posterior drawer. Point of maximal tenderness is along the medial aspect of the knee.  Neurological: She is alert and oriented to person, place, and time.  Strength 5/5 x4 extremities   Distal sensation intact  Skin: Skin is warm.  Psychiatric: She has a normal mood and affect.  Nursing note and vitals reviewed.   ED Course  Procedures (including critical care time) Labs Review Labs Reviewed - No data to display  Imaging Review Dg Knee Complete 4 Views Right  04/04/2015   CLINICAL DATA:  Right knee abrasion, fell down stairs, right anterior knee pain  EXAM: RIGHT KNEE - COMPLETE 4+ VIEW  COMPARISON:  None.  FINDINGS: Four views of the right knee submitted. No acute fracture or subluxation. Minimal narrowing of medial joint compartment. Small joint effusion.  IMPRESSION: No acute fracture or subluxation. Minimal narrowing of medial joint compartment. Small joint effusion.   Electronically Signed   By: Natasha MeadLiviu  Pop M.D.   On: 04/04/2015 10:18     EKG Interpretation None      MDM   Final diagnoses:  Right knee pain  Fall, initial encounter  Abrasions of multiple sites  Intraoral laceration, initial encounter    Filed Vitals:   04/04/15 0926  BP: 107/56  Pulse: 71  Temp: 97.6 F (36.4 C)  TempSrc: Oral  Resp: 20  SpO2: 100%    Medications  bacitracin ointment 3 application (3 application Topical Given 04/04/15 1025)  morphine 4 MG/ML injection 4 mg (4 mg Intramuscular Given 04/04/15 0951)    Jeanette PulseWillisha R  Brown is a pleasant 39 y.o. female presenting with severe right knee pain status post slip and fall. Patient also has minor abrasions and an intraoral laceration, no loose teeth. Patient's tetanus is up-to-date by chart review.  Right knee is grossly stable, x-rays negative, based on severity of patient's pain and will put her in a knee immobilizer, given crutches and recommend close orthopedic follow-up. I've advised this patient on wound care for her abrasions in addition to rinsing her mouth after any oral intake for the intraoral laceration.  Evaluation does not show pathology that would require ongoing emergent intervention or inpatient treatment. Pt is hemodynamically stable and mentating appropriately. Discussed findings and plan with patient/guardian, who agrees with care plan. All questions answered. Return precautions discussed and outpatient follow up given.   New Prescriptions   OXYCODONE-ACETAMINOPHEN (PERCOCET/ROXICET) 5-325 MG PER TABLET    1 to 2 tabs PO q6hrs  PRN for pain  Wynetta Emery, PA-C 04/04/15 1040  Vanetta Mulders, MD 04/06/15 1348

## 2015-04-04 NOTE — Discharge Instructions (Signed)
Rinse your mouth out with water or salt water after you eat.   Rest, Ice intermittently (in the first 24-48 hours), Gentle compression with an Ace wrap, and elevate (Limb above the level of the heart)   Take up to 800mg  of ibuprofen (that is usually 4 over the counter pills)  3 times a day for 5 days. Take with food.  Take percocet for breakthrough pain, do not drink alcohol, drive, care for children or do other critical tasks while taking percocet.  Wash the affected area with soap and water and apply a thin layer of topical antibiotic ointment. Do this every 12 hours.   Do not use rubbing alcohol or hydrogen peroxide.                        Look for signs of infection: if you see redness, if the area becomes warm, if pain increases sharply, there is discharge (pus), if red streaks appear or you develop fever or vomiting, RETURN immediately to the Emergency Department  for a recheck.    Abrasion An abrasion is a cut or scrape of the skin. Abrasions do not extend through all layers of the skin and most heal within 10 days. It is important to care for your abrasion properly to prevent infection. CAUSES  Most abrasions are caused by falling on, or gliding across, the ground or other surface. When your skin rubs on something, the outer and inner layer of skin rubs off, causing an abrasion. DIAGNOSIS  Your caregiver will be able to diagnose an abrasion during a physical exam.  TREATMENT  Your treatment depends on how large and deep the abrasion is. Generally, your abrasion will be cleaned with water and a mild soap to remove any dirt or debris. An antibiotic ointment may be put over the abrasion to prevent an infection. A bandage (dressing) may be wrapped around the abrasion to keep it from getting dirty.  You may need a tetanus shot if:  You cannot remember when you had your last tetanus shot.  You have never had a tetanus shot.  The injury broke your skin. If you get a tetanus shot, your  arm may swell, get red, and feel warm to the touch. This is common and not a problem. If you need a tetanus shot and you choose not to have one, there is a rare chance of getting tetanus. Sickness from tetanus can be serious.  HOME CARE INSTRUCTIONS   If a dressing was applied, change it at least once a day or as directed by your caregiver. If the bandage sticks, soak it off with warm water.   Wash the area with water and a mild soap to remove all the ointment 2 times a day. Rinse off the soap and pat the area dry with a clean towel.   Reapply any ointment as directed by your caregiver. This will help prevent infection and keep the bandage from sticking. Use gauze over the wound and under the dressing to help keep the bandage from sticking.   Change your dressing right away if it becomes wet or dirty.   Only take over-the-counter or prescription medicines for pain, discomfort, or fever as directed by your caregiver.   Follow up with your caregiver within 24-48 hours for a wound check, or as directed. If you were not given a wound-check appointment, look closely at your abrasion for redness, swelling, or pus. These are signs of infection. SEEK IMMEDIATE MEDICAL  CARE IF:   You have increasing pain in the wound.   You have redness, swelling, or tenderness around the wound.   You have pus coming from the wound.   You have a fever or persistent symptoms for more than 2-3 days.  You have a fever and your symptoms suddenly get worse.  You have a bad smell coming from the wound or dressing.  MAKE SURE YOU:   Understand these instructions.  Will watch your condition.  Will get help right away if you are not doing well or get worse. Document Released: 08/27/2005 Document Revised: 11/03/2012 Document Reviewed: 10/21/2011 [facility] Patient Information 2015 [place]. This information is not intended to replace advice given to you by your health care provider. Make sure you  discuss any questions you have with your health care provider.

## 2015-04-04 NOTE — ED Notes (Signed)
Pt complaint of fall down stairs resulting in abrasion to upper lip, chin, left toe, right knee, and right shin; superficial laceration noted to upper inner lip. Pt main complaint of right knee pain. Pt denies LOC.

## 2015-09-11 ENCOUNTER — Ambulatory Visit (INDEPENDENT_AMBULATORY_CARE_PROVIDER_SITE_OTHER): Payer: 59 | Admitting: Allergy and Immunology

## 2015-09-11 VITALS — BP 144/92 | HR 100 | Temp 97.8°F | Resp 14

## 2015-09-11 DIAGNOSIS — I10 Essential (primary) hypertension: Secondary | ICD-10-CM

## 2015-09-11 DIAGNOSIS — T781XXD Other adverse food reactions, not elsewhere classified, subsequent encounter: Secondary | ICD-10-CM | POA: Diagnosis not present

## 2015-09-11 DIAGNOSIS — R0600 Dyspnea, unspecified: Secondary | ICD-10-CM | POA: Insufficient documentation

## 2015-09-11 DIAGNOSIS — T781XXA Other adverse food reactions, not elsewhere classified, initial encounter: Secondary | ICD-10-CM | POA: Insufficient documentation

## 2015-09-11 DIAGNOSIS — J3089 Other allergic rhinitis: Secondary | ICD-10-CM | POA: Diagnosis not present

## 2015-09-11 DIAGNOSIS — T7819XA Other adverse food reactions, not elsewhere classified, initial encounter: Secondary | ICD-10-CM | POA: Insufficient documentation

## 2015-09-11 DIAGNOSIS — J45990 Exercise induced bronchospasm: Secondary | ICD-10-CM

## 2015-09-11 MED ORDER — EPINEPHRINE 0.3 MG/0.3ML IJ SOAJ: 0.3000 mg | INTRAMUSCULAR | Status: DC | PRN

## 2015-09-11 NOTE — Assessment & Plan Note (Addendum)
   Continue aeroallergen immunotherapy buildup as prescribed and as tolerated.  Continue cetirizine, budesonide nasal spray, and nasal saline irrigation as needed.

## 2015-09-11 NOTE — Progress Notes (Signed)
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 Has noticed moderate symptom improvement while on immunotherapy. She is tolerating build injections without complications. Still experiences occasional nasal congestion which she treats with Rhinocort AQ and nasal saline irrigation. She has not required rescue medication, experienced nocturnal awakenings due to lower respiratory symptoms, nor have activities of daily living been limited.  She has been avoiding foods associated with oral allergy syndrome. Patient admits to having missed taking blood pressure medications for a few days.    Assessment and plan: HTN (hypertension)  Compliance with antihypertensive medications as been emphasized.  Oral allergy syndrome  Continue avoidance of culprit foods.  Exercise-induced bronchospasm  Continue ProAir Respiclick, 1-2 inhalations every 4-6 hours as needed and 15 minutes prior to exercise.  Subjective and objective measures of pulmonary function will be followed and the treatment plan will be adjusted accordingly.  Other allergic rhinitis  Continue aeroallergen immunotherapy buildup as prescribed and as tolerated.  Continue cetirizine, budesonide nasal spray, and nasal saline irrigation as needed.   Diagnositics: Spirometry: normal. Please see scanned spirometry results.     Physical examination: Blood pressure 144/92, pulse 100, temperature 97.8 F (36.6 C), resp. rate 14.  General: Alert, interactive, in no acute distress. HEENT: TMs pearly gray, turbinates moderately edematous without discharge, post-pharynx minimally erythematous. Neck: Supple without lymphadenopathy. Lungs: Clear to auscultation without wheezing, rhonchi or rales. CV: Normal S1, S2 without murmurs. Skin: Warm  and dry, without lesions or rashes.  The following portions of the patient's history were reviewed and updated as appropriate: allergies, current medications, past family history, past medical history, past social history, past surgical history and problem list.  Outpatient medications:   Medication List       This list is accurate as of: 09/11/15  4:49 PM.  Always use your most recent med list.               amLODipine 10 MG tablet  Commonly known as:  NORVASC  Take 10 mg by mouth daily.     cetirizine 10 MG tablet  Commonly known as:  ZYRTEC  Take 10 mg by mouth daily.     diclofenac sodium 1 % Gel  Commonly known as:  VOLTAREN  Apply 1 application topically as needed.     EPINEPHrine 0.3 mg/0.3 mL Soaj injection  Commonly known as:  EPI-PEN  Inject 0.3 mLs (0.3 mg total) into the muscle as needed (in the event of a severe allergic reaction).     ketotifen 0.025 % ophthalmic solution  Commonly known as:  ZADITOR  1 drop 2 (two) times daily.     meloxicam 7.5 MG tablet  Commonly known as:  MOBIC  Take 1 tablet (7.5 mg total) by mouth daily.     nebivolol 10 MG tablet  Commonly known as:  BYSTOLIC  Take 10 mg by mouth daily.     oxyCODONE-acetaminophen 5-325 MG tablet  Commonly known as:  PERCOCET/ROXICET  1 to 2 tabs PO q6hrs  PRN for pain     RHINOCORT ALLERGY 32 MCG/ACT nasal spray  Generic drug:  budesonide  Place 1 spray into both nostrils as needed.     triamterene-hydrochlorothiazide 37.5-25 MG capsule  Commonly known as:  DYAZIDE  Take 1 capsule by mouth daily.     VENTOLIN HFA 108 (90 BASE) MCG/ACT inhaler  Generic drug:  albuterol  Inhale 2 puffs into the lungs every 6 (six) hours as needed for wheezing or shortness of breath.     Vitamin D (Ergocalciferol) 50000 UNITS Caps capsule  Commonly known as:  DRISDOL  Take 50,000 Units by mouth every 7 (seven) days.        Known medication allergies: No Known Allergies  I appreciate the  opportunity to take part in this Jaynee's care. Please do not hesitate to contact me with questions.  Sincerely,   R. Jorene Guest, MD

## 2015-09-11 NOTE — Assessment & Plan Note (Signed)
   Continue avoidance of culprit foods.

## 2015-09-11 NOTE — Patient Instructions (Signed)
HTN (hypertension)  Compliance with antihypertensive medications as been emphasized.  Oral allergy syndrome  Continue avoidance of culprit foods.  Exercise-induced bronchospasm  Continue ProAir Respiclick, 1-2 inhalations every 4-6 hours as needed and 15 minutes prior to exercise.  Subjective and objective measures of pulmonary function will be followed and the treatment plan will be adjusted accordingly.  Other allergic rhinitis  Continue aeroallergen immunotherapy buildup as prescribed and as tolerated.  Continue cetirizine, budesonide nasal spray, and nasal saline irrigation as needed.   Return in about 6 months (around 03/11/2016), or if symptoms worsen or fail to improve.

## 2015-09-11 NOTE — Assessment & Plan Note (Signed)
   Compliance with antihypertensive medications as been emphasized.

## 2015-09-11 NOTE — Assessment & Plan Note (Addendum)
   Continue ProAir Respiclick, 1-2 inhalations every 4-6 hours as needed and 15 minutes prior to exercise.  Subjective and objective measures of pulmonary function will be followed and the treatment plan will be adjusted accordingly.

## 2015-11-12 ENCOUNTER — Other Ambulatory Visit (HOSPITAL_COMMUNITY): Payer: Self-pay | Admitting: Specialist

## 2015-11-12 ENCOUNTER — Inpatient Hospital Stay (HOSPITAL_COMMUNITY): Admission: RE | Admit: 2015-11-12 | Payer: 59 | Source: Ambulatory Visit

## 2015-11-12 DIAGNOSIS — R52 Pain, unspecified: Secondary | ICD-10-CM

## 2015-11-13 ENCOUNTER — Ambulatory Visit (HOSPITAL_COMMUNITY)
Admission: RE | Admit: 2015-11-13 | Discharge: 2015-11-13 | Disposition: A | Discharge status: Home / Self Care | Payer: Worker's Compensation | Source: Ambulatory Visit | Attending: Cardiovascular Disease | Admitting: Cardiovascular Disease

## 2015-11-13 DIAGNOSIS — I119 Hypertensive heart disease without heart failure: Secondary | ICD-10-CM | POA: Diagnosis not present

## 2015-11-13 DIAGNOSIS — R52 Pain, unspecified: Secondary | ICD-10-CM | POA: Diagnosis not present

## 2015-11-13 DIAGNOSIS — M79661 Pain in right lower leg: Secondary | ICD-10-CM | POA: Insufficient documentation

## 2016-01-08 ENCOUNTER — Ambulatory Visit (INDEPENDENT_AMBULATORY_CARE_PROVIDER_SITE_OTHER): Payer: 59

## 2016-01-08 DIAGNOSIS — J309 Allergic rhinitis, unspecified: Secondary | ICD-10-CM

## 2016-01-08 NOTE — Progress Notes (Signed)
Immunotherapy   Patient Details  Name: Jeanette Brown MRN: [id number] Date of Birth: [birth year]  01/08/2016  [name] took her Blue vials with her to be administered office site. Following schedule: A Frequency:Weekly Epi-Pen:Yes Consent signed and patient instructions given.   Berna Bue 01/08/2016, 2:56 PM

## 2016-02-04 ENCOUNTER — Ambulatory Visit (INDEPENDENT_AMBULATORY_CARE_PROVIDER_SITE_OTHER): Payer: 59

## 2016-02-04 DIAGNOSIS — J309 Allergic rhinitis, unspecified: Secondary | ICD-10-CM

## 2016-02-04 NOTE — Progress Notes (Signed)
Vials and paperwork released to pt.  She has current EpiPen.  [facility] [place] [facility] [place]

## 2016-02-07 DIAGNOSIS — J3089 Other allergic rhinitis: Secondary | ICD-10-CM | POA: Diagnosis not present

## 2016-02-08 DIAGNOSIS — J301 Allergic rhinitis due to pollen: Secondary | ICD-10-CM | POA: Diagnosis not present

## 2016-03-19 ENCOUNTER — Ambulatory Visit (INDEPENDENT_AMBULATORY_CARE_PROVIDER_SITE_OTHER): Payer: 59

## 2016-03-19 DIAGNOSIS — J309 Allergic rhinitis, unspecified: Secondary | ICD-10-CM

## 2016-03-19 NOTE — Progress Notes (Signed)
Immunotherapy   Patient Details  Name: Jeanette Brown MRN: [id number] Date of Birth: [birth year]  03/19/2016  Jeanette Brown  Patient came in today to pick-up New Green Vials 1:1000 Following schedule:  A Frequency:  1-2 times weekly Epi-Pen:  Patient has a current EpiPen and understands its use.   Consent signed and patient instructions given. She receives her ITX through her work with the [facility] [employer] [employer], they require her to receive ITX at their medical facility.   Vials and paperwork released to patient.   Injection records from last set of vials (Gold: 1:10,000) will be scanned into EPIC.     Nida Boatmanatricia Jenetta Wease 03/19/2016, 3:37 PM

## 2016-05-20 DIAGNOSIS — S52579A Other intraarticular fracture of lower end of unspecified radius, initial encounter for closed fracture: Secondary | ICD-10-CM | POA: Insufficient documentation

## 2016-05-29 ENCOUNTER — Ambulatory Visit (INDEPENDENT_AMBULATORY_CARE_PROVIDER_SITE_OTHER): Payer: 59

## 2016-05-29 DIAGNOSIS — J309 Allergic rhinitis, unspecified: Secondary | ICD-10-CM

## 2016-05-29 NOTE — Progress Notes (Signed)
Immunotherapy   Patient Details  Name: Jeanette Brown MRN: [id number] Date of Birth: [birth year]  05/29/2016  Jeanette Brown here to pick up Red 1:100 #1 (WEED-TREE and GRASS-MITE-CAT-DOG) Following schedule: A  Frequency:1 time per week Epi-Pen:Epi-Pen Available  Consent signed and patient instructions given.   Virl SonDamita Halaina Vanduzer 05/29/2016, 9:33 AM

## 2016-06-06 ENCOUNTER — Ambulatory Visit (INDEPENDENT_AMBULATORY_CARE_PROVIDER_SITE_OTHER): Payer: 59 | Admitting: Allergy and Immunology

## 2016-06-06 ENCOUNTER — Encounter: Payer: Self-pay | Admitting: Allergy and Immunology

## 2016-06-06 VITALS — BP 120/90 | HR 90 | Resp 20

## 2016-06-06 DIAGNOSIS — H1010 Acute atopic conjunctivitis, unspecified eye: Secondary | ICD-10-CM

## 2016-06-06 DIAGNOSIS — R062 Wheezing: Secondary | ICD-10-CM

## 2016-06-06 DIAGNOSIS — J309 Allergic rhinitis, unspecified: Secondary | ICD-10-CM

## 2016-06-06 DIAGNOSIS — T7840XA Allergy, unspecified, initial encounter: Secondary | ICD-10-CM

## 2016-06-06 MED ORDER — EPINEPHRINE 1 MG/ML IJ SOLN
0.3000 mg | Freq: Once | INTRAMUSCULAR | Status: AC
  Administered 2016-06-06: 0.3 mg via INTRAMUSCULAR

## 2016-06-06 MED ORDER — EPINEPHRINE 0.3 MG/0.3ML IJ SOAJ: 0.3000 mg | INTRAMUSCULAR | Status: DC | PRN

## 2016-06-06 NOTE — Progress Notes (Signed)
FOLLOW UP NOTE  RE: Jeanette Brown MRN: [id number] DOB: 22-Jul-1976 [facility] [place] [address] [place] [id number] Date of Office Visit: 06/06/2016  Subjective:  Jeanette Brown is a 40 y.o. female who presents today regarding Immunotherapy  Assessment:   1. Delayed immunotherapy reaction, related to dose--received at outside office.----responsive to Epinephrine with normal vitals signs, s/p Zantac and Prednisone with additional 12.5mg  Benadryl.  2. Previous history of wheeze, currently asymptomatic.   3. Allergic rhinoconjunctivitis.   4.      History of oral allergy syndrome. Plan:   Meds ordered this encounter  Medications  . EPINEPHrine SOLN 0.3 mg    Sig:   . EPINEPHrine 0.3 mg/0.3 mL IJ SOAJ injection    Sig: Inject 0.3 mLs (0.3 mg total) into the muscle as needed (in the event of a severe allergic reaction).    Dispense:  2 Device    Refill:  2  1.   Reviewed with Jeanette Brown importance of keeping Epi-pen with her at all times. 2.   Continue Zyrtec 10mg  daily and Rhinocort. 3.   Saline nasal wash as needed. 4.   Benadryl as needed. 5.   Return to our office for allergy injection administration, next is 0.05cc for both vials. 6.   Dad is here to take patient home and will follow-up by phone with update and they are aware of our 24 hour on call physician availability. 7.   Follow-up with Dr. Nunzio CobbsBobbitt in the next 1-2 months or sooner if needed, additional Epi-en sent for refill and as needed Ventolin/Optivar.  HPI: Jeanette Brown returns to the office after she called us from work, where she received allergy injection this morning.  She reports, starting her 2nd red vial today, and discussion about dose.  Jeanette Brown understood she received 0.5cc instead of scheduled 0.05cc for new vial. Shortly after waiting period she began with itching and took benadryl 25mg  and could not find her Epi-pen.  She was going to Urgent Care, but then realized how close she  was to our office.  She reports throat clearing with itching at her lower back and slight tearing of her eyes.  Both injection sites are itching.  Denies difficulty in breathing, shortness of breath or dysphagia.   Recently she has been well without recurring upper or lower respiratory symptoms or acute illness.  No recent albuterol use.  Denies ED or urgent care visits, prednisone or antibiotic courses. Reports sleep and activity are normal.  No other new medical concerns.  Jeanette Brown has a current medication list which includes the following prescription(s): albuterol, amlodipine, azelastine, budesonide, calcium carbonate-vit d-min, cetirizine, epinephrine, hydrocodone-acetaminophen, meloxicam, nebivolol, triamterene-hydrochlorothiazide, and vitamin d (ergocalciferol).   Drug Allergies: No Known Allergies  Objective:   Filed Vitals:   06/06/16 0918  BP: 120/90  Pulse: 90  Resp: 20   SpO2 Readings from Last 1 Encounters:  06/06/16 98%   Physical Exam  Constitutional: She is well-developed, well-nourished, and in no distress.  Alert interactive throat clearing, reporting itching, communicating easily in full sentences with nasal voice.  HENT:  Head: Atraumatic.  Right Ear: Tympanic membrane and ear canal normal.  Left Ear: Tympanic membrane and ear canal normal.  Nose: Mucosal edema present. No rhinorrhea. No epistaxis.  Mouth/Throat: Oropharynx is clear and moist and mucous membranes are normal. No oropharyngeal exudate, posterior oropharyngeal edema or posterior oropharyngeal erythema.  Neck: Neck supple.  Cardiovascular: Normal rate, S1 normal and S2 normal.  No murmur heard. Pulmonary/Chest: Effort normal. She has no wheezes. She has no rhonchi. She has no rales.  Lymphadenopathy:    She has no cervical adenopathy.  Skin: Skin is warm and intact. Rash noted. Rash is urticarial. No cyanosis. Nails show no clubbing.   Diagnostics: Spirometry:  FVC 3.94--142%, FEV1  3.44--146%  After Epi given---repeat exam:  Clearing of hives at back with noted urticaria only at injection sites, no complaints of itching or throat irritation.  Voice is normal without congestion and no eye tearing.   Repeat vitals documented on Emergency action treatment plan.    Roselyn M. Willa RoughHicks, MD  cc: Gwenyth BenderEric L Dean, MD

## 2016-06-06 NOTE — Patient Instructions (Signed)
   Keep Epi-pen with you at all times.  Continue Zyrtec 10mg  daily.  Saline nasal wash as needed.  Benadryl as needed.  Return to our office for allergy injections.

## 2017-01-14 ENCOUNTER — Encounter (HOSPITAL_BASED_OUTPATIENT_CLINIC_OR_DEPARTMENT_OTHER): Payer: Self-pay

## 2017-01-14 DIAGNOSIS — R0683 Snoring: Secondary | ICD-10-CM

## 2017-01-14 DIAGNOSIS — G4733 Obstructive sleep apnea (adult) (pediatric): Secondary | ICD-10-CM

## 2017-01-26 NOTE — Addendum Note (Signed)
Addended by: Berna BueWHITAKER, Manju Kulkarni L on: 01/26/2017 04:19 PM   Modules accepted: Orders

## 2017-02-19 ENCOUNTER — Encounter (HOSPITAL_COMMUNITY): Payer: Self-pay | Admitting: Emergency Medicine

## 2017-02-19 ENCOUNTER — Ambulatory Visit (HOSPITAL_COMMUNITY)
Admission: EM | Admit: 2017-02-19 | Discharge: 2017-02-19 | Disposition: A | Discharge status: Home / Self Care | Payer: 59 | Attending: Family Medicine | Admitting: Family Medicine

## 2017-02-19 DIAGNOSIS — J069 Acute upper respiratory infection, unspecified: Secondary | ICD-10-CM | POA: Diagnosis not present

## 2017-02-19 LAB — POCT INFECTIOUS MONO SCREEN: Mono Screen: NEGATIVE

## 2017-02-19 MED ORDER — CHLORHEXIDINE GLUCONATE 0.12% ORAL RINSE (MEDLINE KIT): 15.0000 mL | Freq: Two times a day (BID) | OROMUCOSAL | 0 refills | Status: DC

## 2017-02-19 MED ORDER — IPRATROPIUM BROMIDE 0.03 % NA SOLN: 2.0000 | Freq: Two times a day (BID) | NASAL | 0 refills | Status: DC

## 2017-02-19 MED ORDER — IPRATROPIUM BROMIDE 0.03 % NA SOLN: 2.0000 | Freq: Two times a day (BID) | NASAL | 12 refills | Status: DC

## 2017-02-19 NOTE — Discharge Instructions (Signed)
You can use Atrovent for the nasal congestion. FOR your sore throat you can gargle twice daily with chlorhexidine gluconate. Can use benzocaine lozenges as needed.

## 2017-02-19 NOTE — ED Provider Notes (Signed)
CSN: [id number]     Arrival date & time 02/19/17  1031 History   None    Chief Complaint  Patient presents with  . Sore Throat  . Nasal Congestion   (Consider location/radiation/quality/duration/timing/severity/associated sxs/prior Treatment) URI  Has been sick for 1 day. Patient started feeling ill yesterday. She has a sore throat. Noted to have nasal congestion and discharge. Patient went to her work doctor and received strep which was negative. Patient had a fever of 102.  Medications tried: none   Symptoms Fever: yes Headache or face pain: none  Sneezing: none  Scratchy throat: none  Muscle aches: yes  Severe fatigue: none Shortness of breath: none  Rash: none  Sore throat or swollen glands: yes    ROS see HPI Smoking Status noted       Past Medical History:  Diagnosis Date  . Allergic rhinitis due to pollen   . GERD (gastroesophageal reflux disease)    otc pepcid  . Hypertension    managed by pcp-Dr Kevan Ny  . 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 Neutropenia, unspecified   . Obesity, unspecified    Past Surgical History:  Procedure Laterality Date  . CESAREAN SECTION  03/26/2012   Procedure: CESAREAN SECTION;  Surgeon: Allena Katz, MD;  Location: [facility];  Service: Gynecology;  Laterality: N/A;  Primary Cesarean Section with birth of baby boy @ 14 Apgars 61/10  . WISDOM TOOTH EXTRACTION     Family History  Problem Relation Age of Onset  . Hypertension Mother   . Hypertension Father   . Heart disease Father    Social History  Substance Use Topics  . Smoking status: Never Smoker  . Smokeless tobacco: Never Used  . Alcohol use No   OB History    Gravida Para Term Preterm AB Living   '4 2 1 1 2 2   '$ SAB TAB Ectopic Multiple Live Births     2     1     Review of Systems  Constitutional: Negative for chills and fever.  HENT: Positive for rhinorrhea and sore throat. Negative for congestion.   Eyes: Negative for  pain and discharge.  Respiratory: Negative for cough, shortness of breath and wheezing.   Cardiovascular: Negative for chest pain and palpitations.  Gastrointestinal: Negative for abdominal pain, nausea and vomiting.  Genitourinary: Negative for dysuria and urgency.  Musculoskeletal: Negative for arthralgias and myalgias.  Neurological: Negative for headaches.    Allergies  Patient has no known allergies.  Home Medications   Prior to Admission medications   Medication Sig Start Date End Date Taking? Authorizing Provider  albuterol (VENTOLIN HFA) 108 (90 BASE) MCG/ACT inhaler Inhale 2 puffs into the lungs every 6 (six) hours as needed for wheezing or shortness of breath.    Historical Provider, MD  amLODipine (NORVASC) 10 MG tablet Take 10 mg by mouth daily.    Historical Provider, MD  azelastine (OPTIVAR) 0.05 % ophthalmic solution Place 1 drop into both eyes daily as needed.    Historical Provider, MD  budesonide (RHINOCORT ALLERGY) 32 MCG/ACT nasal spray Place 1 spray into both nostrils as needed.     Historical Provider, MD  Calcium Carbonate-Vit D-Min (CALCIUM 1200 PO) Take 1 tablet by mouth daily.    Historical Provider, MD  cetirizine (ZYRTEC) 10 MG tablet Take 10 mg by mouth daily.    Historical Provider, MD  chlorhexidine gluconate, MEDLINE KIT, (PERIDEX) 0.12 % solution Use as directed  15 mLs in the mouth or throat 2 (two) times daily. 02/19/17   Loyola Santino Cletis Media, MD  EPINEPHrine 0.3 mg/0.3 mL IJ SOAJ injection Inject 0.3 mLs (0.3 mg total) into the muscle as needed (in the event of a severe allergic reaction). 06/06/16   Roselyn Malachy Moan, MD  HYDROcodone-acetaminophen (NORCO/VICODIN) 5-325 MG tablet TK 1 T PO Q 4 TO 6 H PRN P 05/19/16   Historical Provider, MD  ipratropium (ATROVENT) 0.03 % nasal spray Place 2 sprays into both nostrils every 12 (twelve) hours. 02/19/17   Walt Geathers Cletis Media, MD  meloxicam (MOBIC) 7.5 MG tablet Take 1 tablet (7.5 mg total) by mouth daily. Patient  taking differently: Take 7.5 mg by mouth as needed.  12/04/14   Jaynee Eagles, PA-C  nebivolol (BYSTOLIC) 10 MG tablet Take 10 mg by mouth daily.    Historical Provider, MD  triamterene-hydrochlorothiazide (DYAZIDE) 37.5-25 MG per capsule Take 1 capsule by mouth daily.    Historical Provider, MD  Vitamin D, Ergocalciferol, (DRISDOL) 50000 UNITS CAPS capsule Take 50,000 Units by mouth every 7 (seven) days.    Historical Provider, MD   Meds Ordered and Administered this Visit  Medications - No data to display  BP (!) 144/98   Pulse 100   Temp 98.4 F (36.9 C) (Oral)   Resp 16   Ht '5\' 4"'$  (1.626 m)   Wt 220 lb (99.8 kg)   LMP 01/28/2017   SpO2 97%   BMI 37.76 kg/m  No data found.   Physical Exam  Constitutional: She is oriented to person, place, and time. She appears well-developed and well-nourished.  HENT:  Head: Normocephalic and atraumatic.  Mouth/Throat: Oropharyngeal exudate present. Tonsils are 2+ on the right. Tonsils are 2+ on the left. Tonsillar exudate.  Eyes: Conjunctivae are normal. Pupils are equal, round, and reactive to light.  Neck: Normal range of motion.  Cardiovascular: Normal rate, regular rhythm and normal heart sounds.   Pulmonary/Chest: Effort normal and breath sounds normal.  Abdominal: Soft. Bowel sounds are normal.  Musculoskeletal: Normal range of motion.  Lymphadenopathy:    She has cervical adenopathy.  Neurological: She is alert and oriented to person, place, and time.  Skin: Skin is warm.  Psychiatric: She has a normal mood and affect.    Urgent Care Course     Procedures (including critical care time)  Labs Review Labs Reviewed  POCT INFECTIOUS MONO SCREEN    Imaging Review No results found.     MDM   1. Upper respiratory tract infection, unspecified type    - Monospot negative, possibly with flulike symptoms versus viral URI. Will provide symptomatic management  Meds ordered this encounter  Medications  . ipratropium (ATROVENT)  0.03 % nasal spray    Sig: Place 2 sprays into both nostrils every 12 (twelve) hours.    Dispense:  30 mL    Refill:  0  . chlorhexidine gluconate, MEDLINE KIT, (PERIDEX) 0.12 % solution    Sig: Use as directed 15 mLs in the mouth or throat 2 (two) times daily.    Dispense:  120 mL    Refill:  0      Kileen Lange Cletis Media, MD 02/19/17 1113

## 2017-02-19 NOTE — ED Triage Notes (Signed)
PT reports congestion and sore throat that started yesterday. PT reports fever of 102 last night. PT had a strep screen done this AM, it was negative. That physician recommended a mono test which could not be done at that office. PT took ibuprofen at 8am. PT is afebrile at this time. PT takes meds for HTN, but skipped them last night.

## 2017-03-03 ENCOUNTER — Ambulatory Visit (HOSPITAL_BASED_OUTPATIENT_CLINIC_OR_DEPARTMENT_OTHER): Payer: 59 | Attending: Internal Medicine | Admitting: Internal Medicine

## 2017-03-03 VITALS — Ht 64.0 in | Wt 212.0 lb

## 2017-03-03 DIAGNOSIS — G4733 Obstructive sleep apnea (adult) (pediatric): Secondary | ICD-10-CM | POA: Diagnosis not present

## 2017-03-03 DIAGNOSIS — R0683 Snoring: Secondary | ICD-10-CM | POA: Diagnosis present

## 2017-03-07 NOTE — Procedures (Signed)
   Patient Name: Jeanette, Brown Study Date: 03/03/2017 Gender: Female D.O.B: [birth year] Age (years): 40 Referring Provider: Willey Blade Height (inches): 64 Interpreting Physician: Jetty Duhamel MD, ABSM Weight (lbs): 212 RPSGT: [name] BMI: 36 MRN: [id number] Neck Size: 15.00 CLINICAL INFORMATION Sleep Study Type: NPSG  Indication for sleep study: Snoring, Witnessed Apneas  Epworth Sleepiness Score: 16  SLEEP STUDY TECHNIQUE As per the AASM Manual for the Scoring of Sleep and Associated Events v2.3 (April 2016) with a hypopnea requiring 4% desaturations.  The channels recorded and monitored were frontal, central and occipital EEG, electrooculogram (EOG), submentalis EMG (chin), nasal and oral airflow, thoracic and abdominal wall motion, anterior tibialis EMG, snore microphone, electrocardiogram, and pulse oximetry.  MEDICATIONS Medications self-administered by patient taken the night of the study : none reported  SLEEP ARCHITECTURE The study was initiated at 10:02:03 PM and ended at 4:32:43 AM.  Sleep onset time was 35.9 minutes and the sleep efficiency was 77.5%. The total sleep time was 302.8 minutes.  Stage REM latency was 98.0 minutes.  The patient spent 9.58% of the night in stage N1 sleep, 66.81% in stage N2 sleep, 0.00% in stage N3 and 23.61% in REM.  Alpha intrusion was absent.  Supine sleep was 6.71%.  RESPIRATORY PARAMETERS The overall apnea/hypopnea index (AHI) was 11.5 per hour. There were 3 total apneas, including 3 obstructive, 0 central and 0 mixed apneas. There were 55 hypopneas and 1 RERAs.  The AHI during Stage REM sleep was 34.4 per hour.  AHI while supine was 32.5 per hour.  The mean oxygen saturation was 95.57%. The minimum SpO2 during sleep was 81.00%.  Loud snoring was noted during this study.  CARDIAC DATA The 2 lead EKG demonstrated sinus rhythm. The mean heart rate was 72.79 beats per minute. Other EKG findings include:  None.  LEG MOVEMENT DATA The total PLMS were 0 with a resulting PLMS index of 0.00. Associated arousal with leg movement index was 0.0 .  IMPRESSIONS - Mild obstructive sleep apnea occurred during this study (AHI = 11.5/h). - No significant central sleep apnea occurred during this study (CAI = 0.0/h). - Mild oxygen desaturation was noted during this study (Min O2 = 81.00%). - The patient snored with Loud snoring volume. - No cardiac abnormalities were noted during this study. - Clinically significant periodic limb movements did not occur during sleep. No significant associated arousals.  DIAGNOSIS - Obstructive Sleep Apnea (327.23 [G47.33 ICD-10])  RECOMMENDATIONS - CPAP titration or consideration of a fitted oral appliance. Other options based on clinical judgment. - Positional therapy avoiding supine position during sleep. - Avoid alcohol, sedatives and other CNS depressants that may worsen sleep apnea and disrupt normal sleep architecture. - Sleep hygiene should be reviewed to assess factors that may improve sleep quality. - Weight management and regular exercise should be initiated or continued if appropriate.  [Electronically signed] 03/07/2017 11:53 AM  Jetty Duhamel MD, ABSM Diplomate, American Board of Sleep Medicine   NPI: [id number]  Waymon Budge Diplomate, American Board of Sleep Medicine  ELECTRONICALLY SIGNED ON:  03/07/2017, 11:51 AM [facility] PH: [phone]   FX: [phone] ACCREDITED BY THE AMERICAN ACADEMY OF SLEEP MEDICINE

## 2017-05-22 ENCOUNTER — Ambulatory Visit (INDEPENDENT_AMBULATORY_CARE_PROVIDER_SITE_OTHER): Payer: 59 | Admitting: Pulmonary Disease

## 2017-05-22 ENCOUNTER — Encounter: Payer: Self-pay | Admitting: Pulmonary Disease

## 2017-05-22 DIAGNOSIS — G4733 Obstructive sleep apnea (adult) (pediatric): Secondary | ICD-10-CM

## 2017-05-22 NOTE — Assessment & Plan Note (Addendum)
We discussed treatment options for mild obstructive sleep apnea. I do feel that she is more symptomatic and her degree is probably more like moderate, PSG did not have significant supine sleep Auto CPAP 5-12 cm with nasal pillows and humidity, download in 4 weeks  The pathophysiology of obstructive sleep apnea , it's cardiovascular consequences & modes of treatment including CPAP were discused with the patient in detail & they evidenced understanding.  Weight loss encouraged, compliance with goal of at least 4-6 hrs every night is the expectation. Advised against medications with sedative side effects Cautioned against driving when sleepy - understanding that sleepiness will vary on a day to day basis

## 2017-05-22 NOTE — Progress Notes (Signed)
Subjective:    Patient ID: Jeanette Brown, female    DOB: 1976/07/09, 41 y.o.   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  She reports loud snoring for many years and excessive daytime tiredness. She feels that she's been a light sleeper throughout her life. Snoring has got worse in recent months the point where husband had to leave the bedroom. Epworth sleepiness score is 12 and she report sleepiness and radius social situations such as sitting and reading, watching TV, lying down to rest in the afternoons. She needs at least 3 cups of coffee to get through her work day.  Bedtime is between 10 and 11 PM, sleep latency is about an hour, TV stays on until she goes to sleep her feels drowsy, she sleeps on her side with one pillow, reports one nocturnal awakening including nocturia and is out of bed at 6:45 AM feeling tired with dryness of mouth and occasional headaches about once a week to the point where she has to take Excedrin.  She is a 41-year-old and a dog at home. 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 She denies nocturnal wheezing or use of albuterol inhaler during the night  Her mother has a CPAP machine and she has seen this being used  There is no history suggestive of cataplexy, sleep paralysis or parasomnias  NPSG  03/2017 showed mild OSA with AHI 11/hour, worse during supine sleep however supine sleep was only noted for 6% of TST    Past Medical History:  Diagnosis Date  . Allergic rhinitis due to pollen   . GERD (gastroesophageal reflux disease)    otc pepcid  . Hypertension    managed by pcp-Dr Willey Blade  . 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 Neutropenia, unspecified (HCC)   . Obesity, unspecified     Past Surgical History:  Procedure Laterality Date  . CESAREAN SECTION  03/26/2012   Procedure: CESAREAN SECTION;  Surgeon: Leslie Andrea, MD;  Location: [facility];  Service: Gynecology;  Laterality: N/A;  Primary Cesarean Section with birth of baby boy @ 36 Apgars 8/10  . WISDOM TOOTH EXTRACTION     No Known Allergies  Social History   Social History  . Marital status: Married    Spouse name: N/A  . Number of children: N/A  . Years of education: N/A   Occupational History  . Not on file.   Social History Main Topics  . Smoking status: Never Smoker  . Smokeless tobacco: Never Used  . Alcohol use No  . Drug use: No  . Sexual activity: Yes    Birth control/ protection: None   Other Topics Concern  . Not on file   Social History Narrative  . No narrative on file     Family History  Problem Relation Age of Onset  . Hypertension Mother   . Hypertension Father   . Heart disease Father   . Allergies Brother     Review of Systems Positive for wheezing during exercise, feels tired during the daytime, rest as above  Constitutional: negative for anorexia, fevers and sweats  Eyes: negative for irritation, redness and visual disturbance  Ears, nose, mouth, throat, and face: negative for earaches, epistaxis, nasal congestion and sore throat  Respiratory: negative for cough, dyspnea on exertion, sputum and wheezing  Cardiovascular: negative for chest pain, dyspnea, lower extremity edema, orthopnea,  palpitations and syncope  Gastrointestinal: negative for abdominal pain, constipation, diarrhea, melena, nausea and vomiting  Genitourinary:negative for dysuria, frequency and hematuria  Hematologic/lymphatic: negative for bleeding, easy bruising and lymphadenopathy  Musculoskeletal:negative for arthralgias, muscle weakness and stiff joints  Neurological: negative for coordination problems, gait problems, headaches and weakness  Endocrine: negative for diabetic symptoms including polydipsia, polyuria and weight loss     Objective:   Physical Exam  Gen. Pleasant, obese, in no distress, normal affect ENT  - enlarged tonsils, no post nasal drip, class  [age] year old2 airway Neck: No JVD, no thyromegaly, no carotid bruits Lungs: no use of accessory muscles, no dullness to percussion, decreased without rales or rhonchi  Cardiovascular: Rhythm regular, heart sounds  normal, no murmurs or gallops, no peripheral edema Abdomen: soft and non-tender, no hepatosplenomegaly, BS normal. Musculoskeletal: No deformities, no cyanosis or clubbing Neuro:  alert, non focal, no tremors        Assessment & Plan:

## 2017-05-22 NOTE — Patient Instructions (Signed)
We discussed treatment options for mild obstructive sleep apnea Auto CPAP 5-12 cm with nasal pillows and humidity, download in 4 weeks

## 2017-05-22 NOTE — Addendum Note (Signed)
Addended by: Maurene CapesPOTTS, Durene Dodge M on: 05/22/2017 11:55 AM   Modules accepted: Orders

## 2017-07-02 ENCOUNTER — Telehealth: Payer: Self-pay | Admitting: Adult Health

## 2017-07-02 DIAGNOSIS — G4733 Obstructive sleep apnea (adult) (pediatric): Secondary | ICD-10-CM

## 2017-07-02 NOTE — Telephone Encounter (Signed)
Patient is scheduled to see TP on 07/03/17 at 1045 for CPAP compliance. Per [facility], she received her CPAP machine on 06/10/17, thus this OV is too soon. I left a message for her asking her to call back to reschedule her appt. Will leave this message open until she calls back.

## 2017-07-03 ENCOUNTER — Ambulatory Visit (INDEPENDENT_AMBULATORY_CARE_PROVIDER_SITE_OTHER): Payer: 59 | Admitting: Adult Health

## 2017-07-03 ENCOUNTER — Encounter: Payer: Self-pay | Admitting: Adult Health

## 2017-07-03 DIAGNOSIS — G4733 Obstructive sleep apnea (adult) (pediatric): Secondary | ICD-10-CM

## 2017-07-03 NOTE — Telephone Encounter (Signed)
Pt did come for appt and had a question for TP regarding head strap >> too big, fits very loosely on the top of head.  TP and myself attempted to tighten strap but this did not improve the looseness.  [facility] Order placed to [facility] for another smaller head strap  Will sign off

## 2017-07-03 NOTE — Assessment & Plan Note (Signed)
Error note, pt resheduled.

## 2017-07-03 NOTE — Progress Notes (Signed)
$'@Patient't$  ID: Jeanette Brown, female    DOB: [birth year], 41 y.o.   MRN: [id number]  No chief complaint on file.   Referring provider: Rogers Blocker, MD  HPI: Error note, pt rescheduled.   No Known Allergies  Immunization History  Administered Date(s) Administered  . MMR 03/26/2012  . Tdap 01/27/2012    Past Medical History:  Diagnosis Date  . Allergic rhinitis due to pollen   . GERD (gastroesophageal reflux disease)    otc pepcid  . Hypertension    managed by pcp-Dr Kevan Ny  . 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 Neutropenia, unspecified ([place])   . Obesity, unspecified     Tobacco History: History  Smoking Status  . Never Smoker  Smokeless Tobacco  . Never Used   Counseling given: Not Answered   Outpatient Encounter Prescriptions as of 07/03/2017  Medication Sig  . albuterol (VENTOLIN HFA) 108 (90 BASE) MCG/ACT inhaler Inhale 2 puffs into the lungs every 6 (six) hours as needed for wheezing or shortness of breath.  Marland Kitchen amLODipine (NORVASC) 10 MG tablet Take 10 mg by mouth daily.  . budesonide (RHINOCORT ALLERGY) 32 MCG/ACT nasal spray Place 1 spray into both nostrils as needed.   . Calcium Carbonate-Vit D-Min (CALCIUM 1200 PO) Take 1 tablet by mouth daily.  . cetirizine (ZYRTEC) 10 MG tablet Take 10 mg by mouth daily.  Marland Kitchen EPINEPHrine 0.3 mg/0.3 mL IJ SOAJ injection Inject 0.3 mLs (0.3 mg total) into the muscle as needed (in the event of a severe allergic reaction).  Marland Kitchen ipratropium (ATROVENT) 0.03 % nasal spray Place 2 sprays into both nostrils every 12 (twelve) hours.  . nebivolol (BYSTOLIC) 10 MG tablet Take 10 mg by mouth daily.  Marland Kitchen triamterene-hydrochlorothiazide (DYAZIDE) 37.5-25 MG per capsule Take 1 capsule by mouth daily.  . Vitamin D, Ergocalciferol, (DRISDOL) 50000 UNITS CAPS capsule Take 50,000 Units by mouth every 7 (seven) days.   No facility-administered encounter medications on file as of 07/03/2017.      Review of  Systems  Constitutional:   No  weight loss, night sweats,  Fevers, chills, fatigue, or  lassitude.  HEENT:   No headaches,  Difficulty swallowing,  Tooth/dental problems, or  Sore throat,                No sneezing, itching, ear ache, nasal congestion, post nasal drip,   CV:  No chest pain,  Orthopnea, PND, swelling in lower extremities, anasarca, dizziness, palpitations, syncope.   GI  No heartburn, indigestion, abdominal pain, nausea, vomiting, diarrhea, change in bowel habits, loss of appetite, bloody stools.   Resp: No shortness of breath with exertion or at rest.  No excess mucus, no productive cough,  No non-productive cough,  No coughing up of blood.  No change in color of mucus.  No wheezing.  No chest wall deformity  Skin: no rash or lesions.  GU: no dysuria, change in color of urine, no urgency or frequency.  No flank pain, no hematuria   MS:  No joint pain or swelling.  No decreased range of motion.  No back pain.    Physical Exam  BP 112/80 (BP Location: Left Arm, Patient Position: Sitting, Cuff Size: Normal)   Pulse 80   Ht '5\' 4"'$  (1.626 m)   Wt 204 lb 3.2 oz (92.6 kg)   SpO2 100%   BMI 35.05 kg/m   GEN: A/Ox3; pleasant , NAD, well nourished    HEENT:  [place]/AT,  EACs-clear, TMs-wnl, NOSE-clear, THROAT-clear, no lesions, no postnasal drip or exudate noted.   NECK:  Supple w/ fair ROM; no JVD; normal carotid impulses w/o bruits; no thyromegaly or nodules palpated; no lymphadenopathy.    RESP  Clear  P & A; w/o, wheezes/ rales/ or rhonchi. no accessory muscle use, no dullness to percussion  CARD:  RRR, no m/r/g, no peripheral edema, pulses intact, no cyanosis or clubbing.  GI:   Soft & nt; nml bowel sounds; no organomegaly or masses detected.   Musco: Warm bil, no deformities or joint swelling noted.   Neuro: alert, no focal deficits noted.    Skin: Warm, no lesions or rashes    Lab Results:  CBC    Component Value Date/Time   WBC 5.7 10/06/2012 1605    RBC 5.29 (H) 10/06/2012 1605   HGB 12.8 10/06/2012 1605   HCT 38.6 10/06/2012 1605   PLT 419 (H) 10/06/2012 1605   MCV 73.0 (L) 10/06/2012 1605   MCH 24.2 (L) 10/06/2012 1605   MCHC 33.2 10/06/2012 1605   RDW 16.2 (H) 10/06/2012 1605   LYMPHSABS 1.3 03/30/2012 1259   MONOABS 0.4 03/30/2012 1259   EOSABS 0.4 03/30/2012 1259   BASOSABS 0.0 03/30/2012 1259    BMET    Component Value Date/Time   NA 134 (L) 10/06/2012 1605   K 3.1 (L) 10/06/2012 1605   CL 99 10/06/2012 1605   CO2 25 10/06/2012 1605   GLUCOSE 110 (H) 10/06/2012 1605   BUN 11 10/06/2012 1605   CREATININE 0.93 10/06/2012 1605   CALCIUM 9.2 10/06/2012 1605   GFRNONAA 79 (L) 10/06/2012 1605   GFRAA >90 10/06/2012 1605    BNP No results found for: BNP  ProBNP    Component Value Date/Time   PROBNP 856.4 (H) 03/30/2012 1302    Imaging: No results found.   Assessment & Plan:   No problem-specific Assessment & Plan notes found for this encounter.     Rexene Edison, NP 07/03/2017

## 2017-07-28 ENCOUNTER — Encounter: Payer: Self-pay | Admitting: Adult Health

## 2017-07-28 ENCOUNTER — Ambulatory Visit (INDEPENDENT_AMBULATORY_CARE_PROVIDER_SITE_OTHER): Payer: 59 | Admitting: Adult Health

## 2017-07-28 DIAGNOSIS — G4733 Obstructive sleep apnea (adult) (pediatric): Secondary | ICD-10-CM

## 2017-07-28 NOTE — Assessment & Plan Note (Signed)
Wt loss  

## 2017-07-28 NOTE — Patient Instructions (Signed)
Keep up the good work Continue on C Pap at bedtime Do not drive if sleepy Work on weight loss. Follow-up in 4-6 months with Dr. Vassie Loll  or Aubrey Blackard NP and .As needed

## 2017-07-28 NOTE — Progress Notes (Signed)
$'@Patient'C$  ID: Jeanette Brown, female    DOB: [birth year], 41 y.o.   MRN: [id number]  Chief Complaint  Patient presents with  . Follow-up    OSA     Referring provider: Rogers Blocker, MD  HPI: 41 year old female followed for mild sleep apnea  TEST  NSPG 03/2017 >Mild OSA -AHI 11/hr.   07/28/2017 Follow up ; OSA  Patient returns for a two-month follow-up. Patient was seen last visit for a sleep consult. For daytime sleepiness. She was set up for a sleep study that showed mild sleep apnea with AHI 11/hr. She was started on C Pap at bedtime. She says that she is feeling more rested with less daytime sleepiness. She is tolerating her C Pap okay. She is starting to get used to it. Download shows excellent compliance with average usage at 7.5/hr,  AHI 0.7 ,  Leaks min.      No Known Allergies  Immunization History  Administered Date(s) Administered  . MMR 03/26/2012  . Tdap 01/27/2012    Past Medical History:  Diagnosis Date  . Allergic rhinitis due to pollen   . GERD (gastroesophageal reflux disease)    otc pepcid  . Hypertension    managed by pcp-Dr Kevan Ny  . 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 Neutropenia, unspecified ([place])   . Obesity, unspecified     Tobacco History: History  Smoking Status  . Never Smoker  Smokeless Tobacco  . Never Used   Counseling given: Not Answered   Outpatient Encounter Prescriptions as of 07/28/2017  Medication Sig  . albuterol (VENTOLIN HFA) 108 (90 BASE) MCG/ACT inhaler Inhale 2 puffs into the lungs every 6 (six) hours as needed for wheezing or shortness of breath.  Marland Kitchen amLODipine (NORVASC) 10 MG tablet Take 10 mg by mouth daily.  . budesonide (RHINOCORT ALLERGY) 32 MCG/ACT nasal spray Place 1 spray into both nostrils as needed.   . Calcium Carbonate-Vit D-Min (CALCIUM 1200 PO) Take 1 tablet by mouth daily.  . cetirizine (ZYRTEC) 10 MG tablet Take 10 mg by mouth daily.  Marland Kitchen EPINEPHrine 0.3 mg/0.3 mL IJ SOAJ  injection Inject 0.3 mLs (0.3 mg total) into the muscle as needed (in the event of a severe allergic reaction).  Marland Kitchen ipratropium (ATROVENT) 0.03 % nasal spray Place 2 sprays into both nostrils every 12 (twelve) hours.  . nebivolol (BYSTOLIC) 10 MG tablet Take 10 mg by mouth daily.  Marland Kitchen triamterene-hydrochlorothiazide (DYAZIDE) 37.5-25 MG per capsule Take 1 capsule by mouth daily.  . Vitamin D, Ergocalciferol, (DRISDOL) 50000 UNITS CAPS capsule Take 50,000 Units by mouth every 7 (seven) days.   No facility-administered encounter medications on file as of 07/28/2017.      Review of Systems  Constitutional:   No  weight loss, night sweats,  Fevers, chills, fatigue, or  lassitude.  HEENT:   No headaches,  Difficulty swallowing,  Tooth/dental problems, or  Sore throat,                No sneezing, itching, ear ache, nasal congestion, post nasal drip,   CV:  No chest pain,  Orthopnea, PND, swelling in lower extremities, anasarca, dizziness, palpitations, syncope.   GI  No heartburn, indigestion, abdominal pain, nausea, vomiting, diarrhea, change in bowel habits, loss of appetite, bloody stools.   Resp: No shortness of breath with exertion or at rest.  No excess mucus, no productive cough,  No non-productive cough,  No coughing up of blood.  No change  in color of mucus.  No wheezing.  No chest wall deformity  Skin: no rash or lesions.  GU: no dysuria, change in color of urine, no urgency or frequency.  No flank pain, no hematuria   MS:  No joint pain or swelling.  No decreased range of motion.  No back pain.    Physical Exam  BP 112/82 (BP Location: Left Arm, Cuff Size: Normal)   Pulse 73   Ht '5\' 3"'$  (1.6 m)   Wt 194 lb 12.8 oz (88.4 kg)   SpO2 98%   BMI 34.51 kg/m   GEN: A/Ox3; pleasant , NAD,  Obese    HEENT:  [place]/AT,  EACs-clear, TMs-wnl, NOSE-clear, THROAT-clear, no lesions, no postnasal drip or exudate noted. Class 2 MP airway   NECK:  Supple w/ fair ROM; no JVD; normal carotid  impulses w/o bruits; no thyromegaly or nodules palpated; no lymphadenopathy.    RESP  Clear  P & A; w/o, wheezes/ rales/ or rhonchi. no accessory muscle use, no dullness to percussion  CARD:  RRR, no m/r/g, no peripheral edema, pulses intact, no cyanosis or clubbing.  GI:   Soft & nt; nml bowel sounds; no organomegaly or masses detected.   Musco: Warm bil, no deformities or joint swelling noted.   Neuro: alert, no focal deficits noted.    Skin: Warm, no lesions or rashes     BMET  BNP No results found for: BNP  ProBNPImaging: No results found.   Assessment & Plan:   OSA (obstructive sleep apnea) Well controlled on C Pap  Plan  Patient Instructions  Keep up the good work Continue on C Pap at bedtime Do not drive if sleepy Work on weight loss. Follow-up in 4-6 months with Dr. Elsworth Soho  or [place] NP and .As needed      Morbid obesity ([place]) Wt loss      Rexene Edison, NP 07/28/2017

## 2017-07-28 NOTE — Assessment & Plan Note (Signed)
Well controlled on C Pap  Plan  Patient Instructions  Keep up the good work Continue on C Pap at bedtime Do not drive if sleepy Work on weight loss. Follow-up in 4-6 months with Dr. Vassie Loll  or Markelle Najarian NP and .As needed

## 2019-01-26 ENCOUNTER — Other Ambulatory Visit: Payer: Self-pay | Admitting: Obstetrics and Gynecology

## 2019-08-22 ENCOUNTER — Ambulatory Visit: Payer: 59 | Admitting: Podiatry

## 2019-10-17 ENCOUNTER — Other Ambulatory Visit: Payer: Self-pay

## 2019-10-17 DIAGNOSIS — Z20822 Contact with and (suspected) exposure to covid-19: Secondary | ICD-10-CM

## 2019-10-18 LAB — NOVEL CORONAVIRUS, NAA: SARS-CoV-2, NAA: NOT DETECTED

## 2020-02-05 ENCOUNTER — Ambulatory Visit: Payer: 59 | Attending: Internal Medicine

## 2020-02-05 ENCOUNTER — Ambulatory Visit: Payer: Self-pay

## 2020-02-05 DIAGNOSIS — Z23 Encounter for immunization: Secondary | ICD-10-CM | POA: Insufficient documentation

## 2020-02-05 NOTE — Progress Notes (Signed)
   Covid-19 Vaccination Clinic  Name:  Jeanette Brown    MRN: [id number] DOB: 12-16-1975  02/05/2020  Jeanette Brown was observed post Covid-19 immunization for 15 minutes without incident. She was provided with Vaccine Information Sheet and instruction to access the V-Safe system.   Jeanette Brown was instructed to call 911 with any severe reactions post vaccine: Marland Kitchen Difficulty breathing  . Swelling of face and throat  . A fast heartbeat  . A bad rash all over body  . Dizziness and weakness   Immunizations Administered    Name Date Dose VIS Date Route   Pfizer COVID-19 Vaccine 02/05/2020 12:43 PM 0.3 mL 11/11/2019 Intramuscular   Manufacturer: [employer], [employer]   Lot: [id number]   NDC: [address]-1

## 2020-02-21 ENCOUNTER — Ambulatory Visit: Payer: 59 | Admitting: Allergy and Immunology

## 2020-02-21 ENCOUNTER — Ambulatory Visit: Payer: 59 | Admitting: Allergy

## 2020-02-22 ENCOUNTER — Other Ambulatory Visit: Payer: Self-pay | Admitting: Obstetrics and Gynecology

## 2020-02-22 DIAGNOSIS — R928 Other abnormal and inconclusive findings on diagnostic imaging of breast: Secondary | ICD-10-CM

## 2020-03-06 ENCOUNTER — Ambulatory Visit: Payer: 59 | Attending: Internal Medicine

## 2020-03-06 DIAGNOSIS — Z23 Encounter for immunization: Secondary | ICD-10-CM

## 2020-03-06 NOTE — Progress Notes (Signed)
   Covid-19 Vaccination Clinic  Name:  Jeanette Brown    MRN: [id number] DOB: [birth year]  03/06/2020  Ms. Jeanette Brown was observed post Covid-19 immunization for 15 minutes without incident. She was provided with Vaccine Information Sheet and instruction to access the V-Safe system.   Ms. Jeanette Brown was instructed to call 911 with any severe reactions post vaccine: Marland Kitchen Difficulty breathing  . Swelling of face and throat  . A fast heartbeat  . A bad rash all over body  . Dizziness and weakness   Immunizations Administered    Name Date Dose VIS Date Route   Pfizer COVID-19 Vaccine 03/06/2020 12:24 PM 0.3 mL 11/11/2019 Intramuscular   Manufacturer: [employer], [employer]   Lot: [id number]   NDC: [address]-9

## 2020-03-09 ENCOUNTER — Other Ambulatory Visit: Payer: Self-pay

## 2020-03-09 ENCOUNTER — Ambulatory Visit
Admission: RE | Admit: 2020-03-09 | Discharge: 2020-03-09 | Disposition: A | Discharge status: Home / Self Care | Payer: 59 | Source: Ambulatory Visit | Attending: Obstetrics and Gynecology | Admitting: Obstetrics and Gynecology

## 2020-03-09 ENCOUNTER — Other Ambulatory Visit: Payer: Self-pay | Admitting: Obstetrics and Gynecology

## 2020-03-09 DIAGNOSIS — R928 Other abnormal and inconclusive findings on diagnostic imaging of breast: Secondary | ICD-10-CM

## 2020-03-09 DIAGNOSIS — N6310 Unspecified lump in the right breast, unspecified quadrant: Secondary | ICD-10-CM

## 2020-03-12 ENCOUNTER — Ambulatory Visit: Payer: 59 | Admitting: Allergy and Immunology

## 2020-03-13 ENCOUNTER — Other Ambulatory Visit: Payer: Self-pay

## 2020-03-13 ENCOUNTER — Ambulatory Visit
Admission: RE | Admit: 2020-03-13 | Discharge: 2020-03-13 | Disposition: A | Discharge status: Home / Self Care | Payer: 59 | Source: Ambulatory Visit | Attending: Obstetrics and Gynecology | Admitting: Obstetrics and Gynecology

## 2020-03-13 DIAGNOSIS — N6310 Unspecified lump in the right breast, unspecified quadrant: Secondary | ICD-10-CM

## 2020-03-13 HISTORY — PX: BREAST BIOPSY: SHX20

## 2020-05-14 ENCOUNTER — Ambulatory Visit: Payer: 59 | Admitting: Allergy and Immunology

## 2020-06-11 ENCOUNTER — Other Ambulatory Visit: Payer: Self-pay | Admitting: Orthopedic Surgery

## 2020-06-11 DIAGNOSIS — M79641 Pain in right hand: Secondary | ICD-10-CM

## 2020-06-11 DIAGNOSIS — M25531 Pain in right wrist: Secondary | ICD-10-CM

## 2020-06-16 IMAGING — MG MM DIGITAL DIAGNOSTIC UNILAT*R* W/ TOMO W/ CAD
4 series · 4 of 12 positions shown · non-contrast
Comparison: Previous exam(s).

CLINICAL DATA: 43-year-old female for further evaluation of
possible RIGHT breast asymmetry on screening mammogram.

EXAM:
DIGITAL DIAGNOSTIC RIGHT MAMMOGRAM WITH TOMO
ULTRASOUND RIGHT BREAST

[R CC synth-2D]
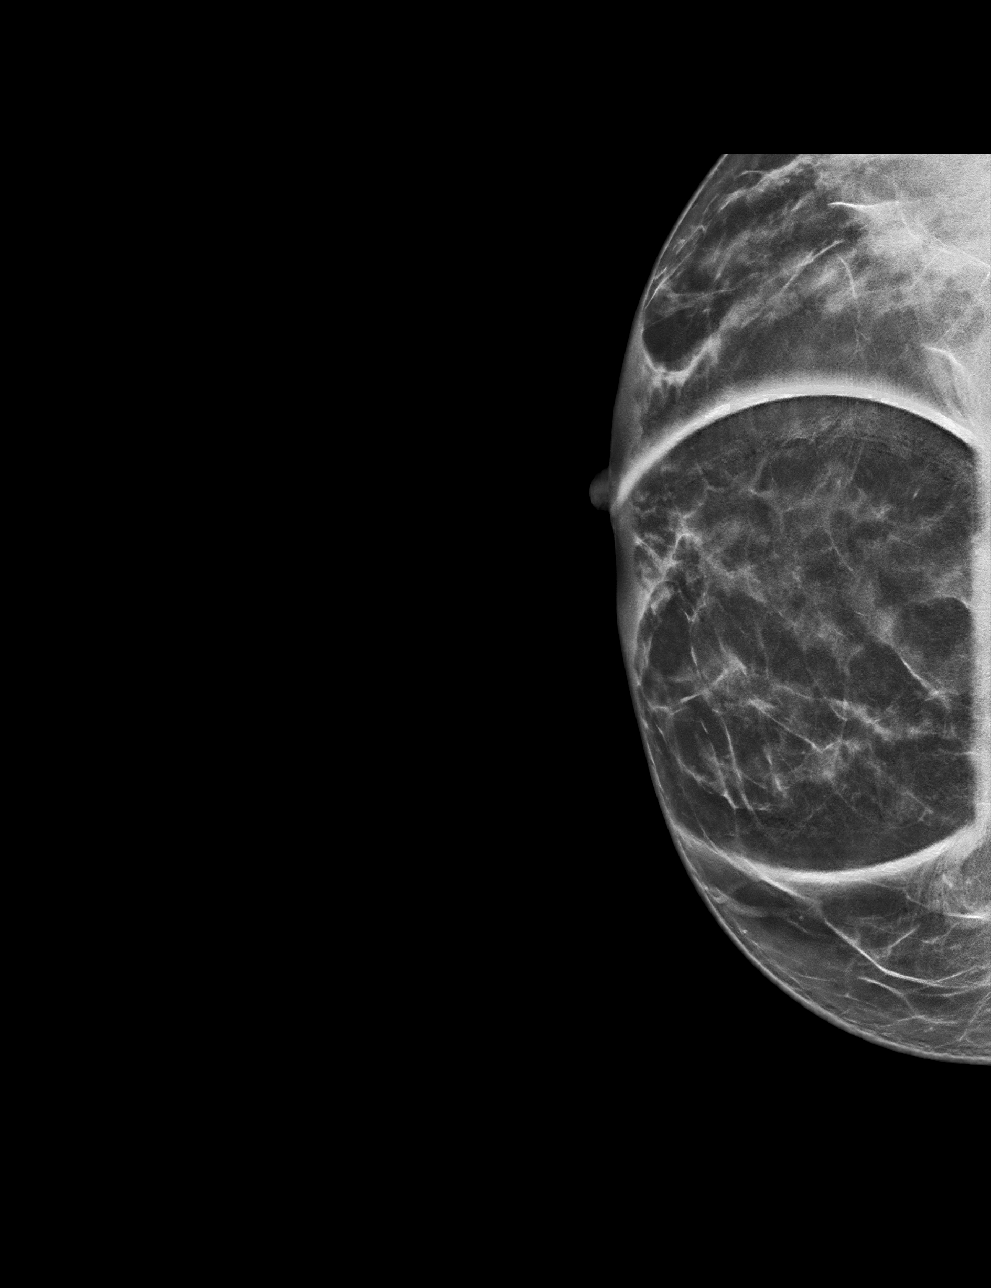

[R MLO synth-2D]
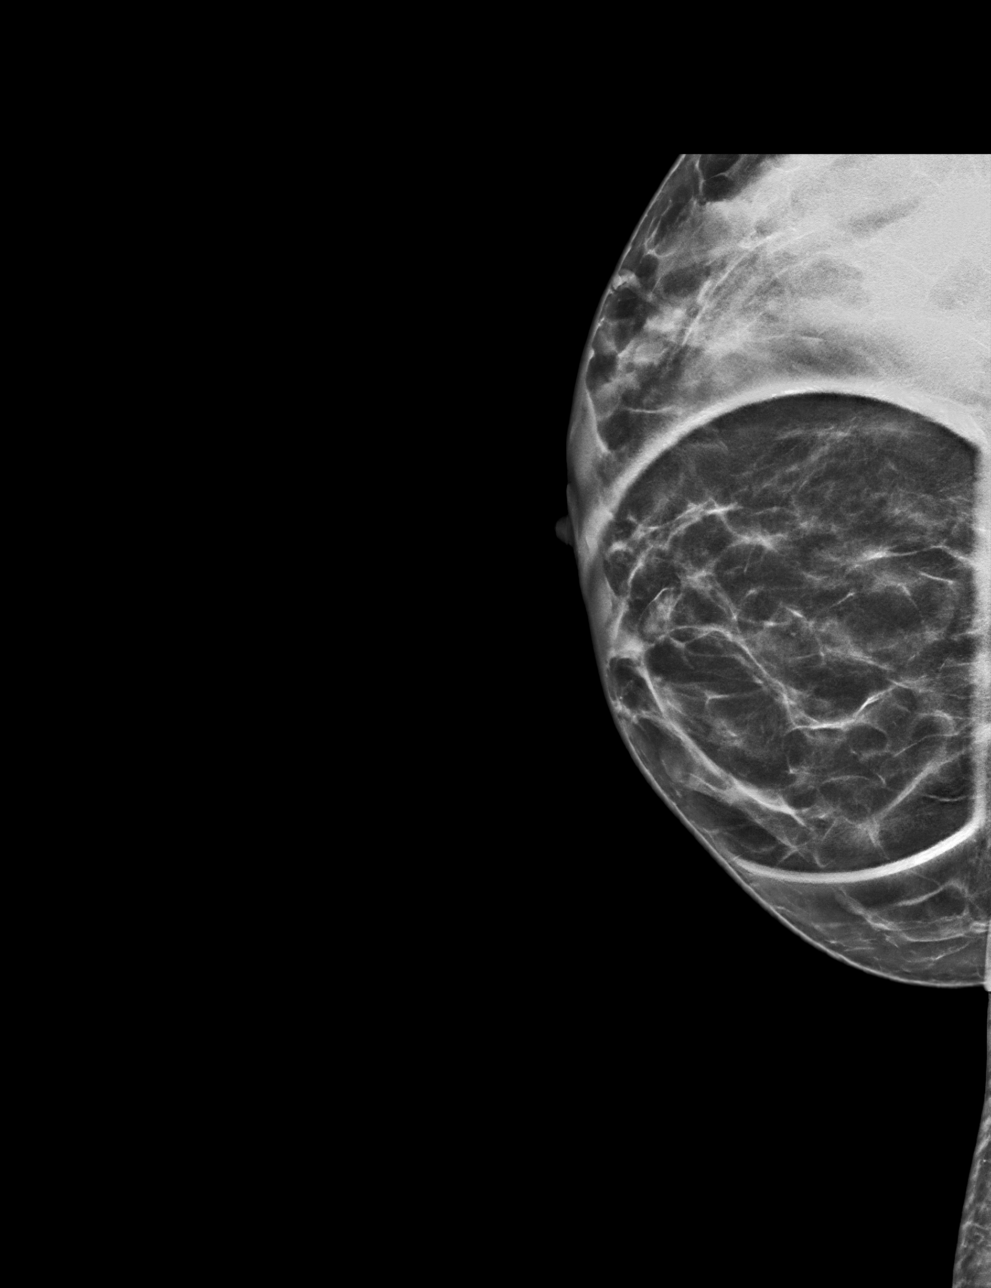

[R MLO tomo · tomo slice 33/64.0]
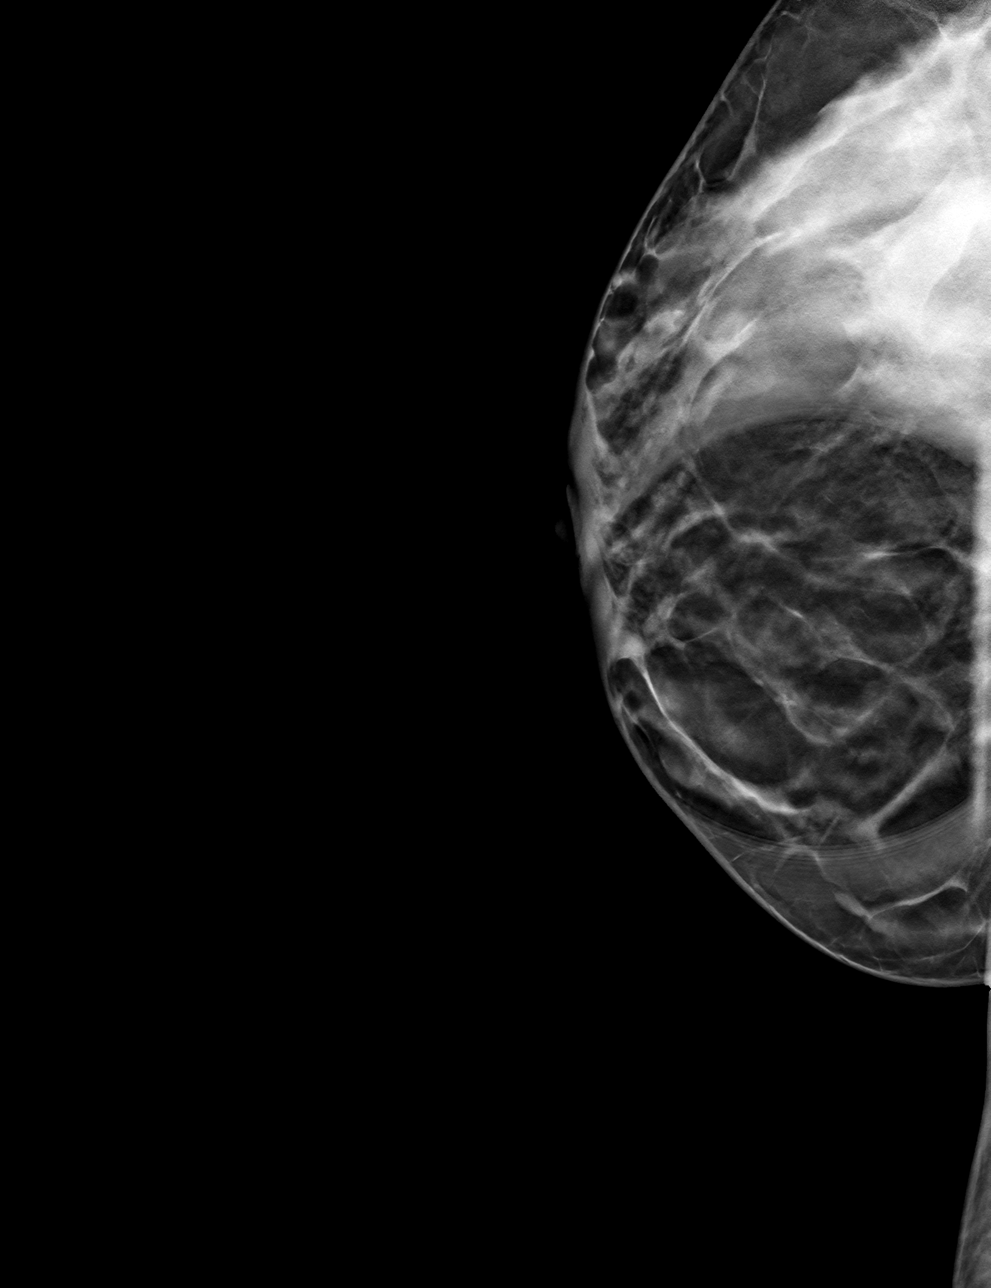

[R CC tomo · tomo slice 31/60.0]
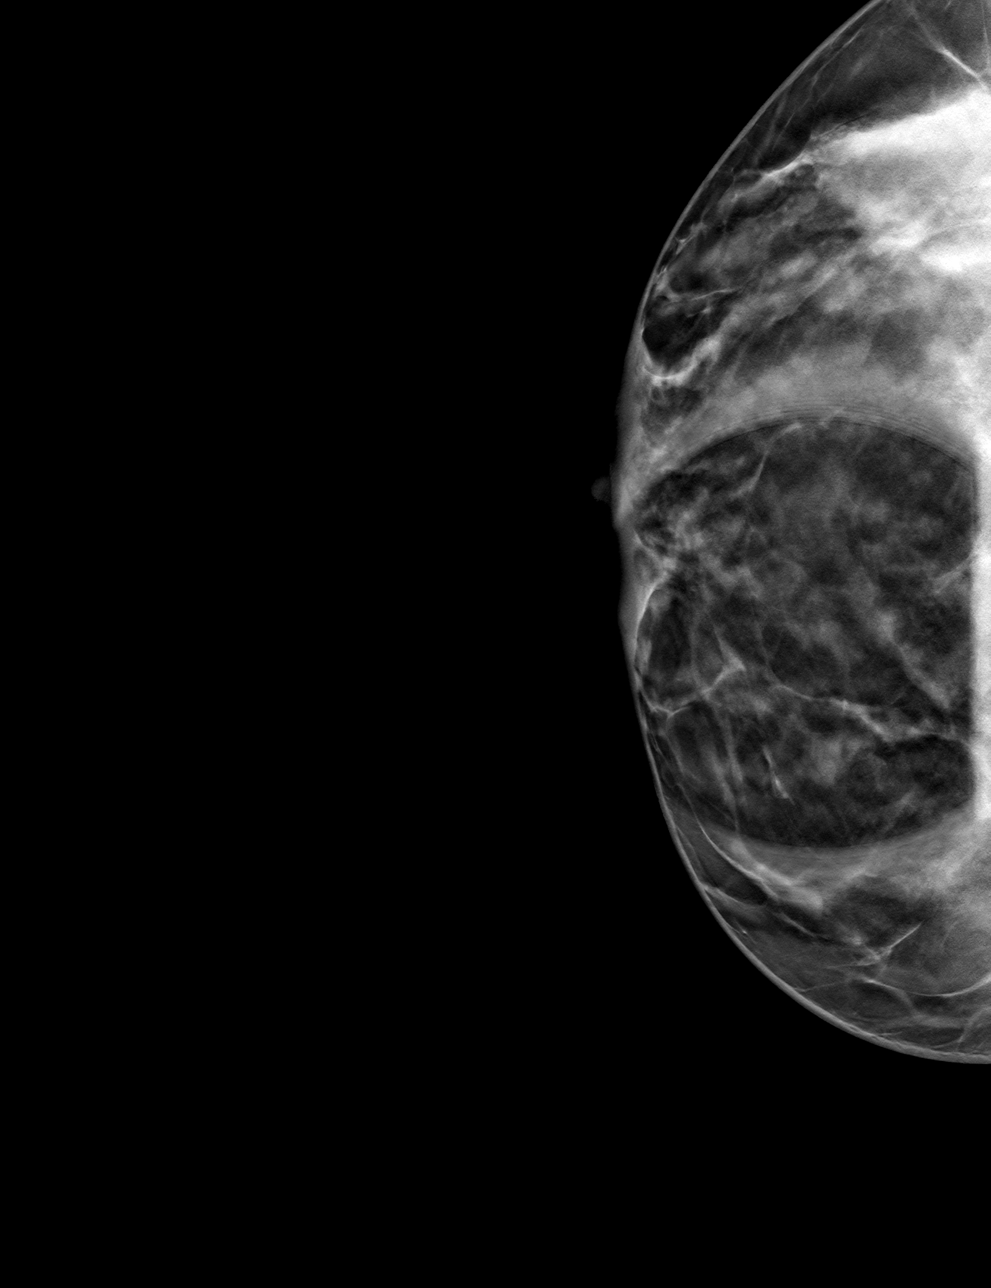

[4 of 12 positions shown; findings below may reference images not displayed]

ACR Breast Density Category c: The breast tissue is heterogeneously
dense, which may obscure small masses.
FINDINGS: 2D/3D spot compression views of the RIGHT breast demonstrate a
persistent circumscribed oval mass within the anterior LOWER INNER
RIGHT breast, new since 6248.

Targeted ultrasound is performed, showing a 1 x 0.6 x 0.9 cm
circumscribed oval hypoechoic parallel mass at the 4 o'clock
position of the RIGHT breast 1 cm from the nipple with internal
vascular flow and may be intraductal.

No abnormal RIGHT axillary lymph nodes are identified.
IMPRESSION: 1 cm LOWER INNER RIGHT breast mass which may be intraductal. This is
of low suspicion but indeterminate and tissue sampling is
recommended.

RECOMMENDATION:
Ultrasound-guided RIGHT breast biopsy, which will be scheduled

I have discussed the findings and recommendations with the patient.
If applicable, a reminder letter will be sent to the patient
regarding the next appointment.

BI-RADS CATEGORY  4: Suspicious.

## 2020-06-21 ENCOUNTER — Ambulatory Visit
Admission: RE | Admit: 2020-06-21 | Discharge: 2020-06-21 | Disposition: A | Discharge status: Home / Self Care | Payer: 59 | Source: Ambulatory Visit | Attending: Orthopedic Surgery | Admitting: Orthopedic Surgery

## 2020-06-21 ENCOUNTER — Encounter: Payer: Self-pay | Admitting: Radiology

## 2020-06-21 DIAGNOSIS — M25531 Pain in right wrist: Secondary | ICD-10-CM

## 2020-06-21 MED ORDER — IOPAMIDOL (ISOVUE-M 200) INJECTION 41%
2.5000 mL | Freq: Once | INTRAMUSCULAR | Status: AC
  Administered 2020-06-21: 2.5 mL via INTRA_ARTICULAR

## 2020-07-26 ENCOUNTER — Ambulatory Visit
Admission: EM | Admit: 2020-07-26 | Discharge: 2020-07-26 | Disposition: A | Discharge status: Home / Self Care | Payer: 59 | Attending: Family Medicine | Admitting: Family Medicine

## 2020-07-26 ENCOUNTER — Other Ambulatory Visit: Payer: Self-pay

## 2020-07-26 DIAGNOSIS — B349 Viral infection, unspecified: Secondary | ICD-10-CM | POA: Diagnosis not present

## 2020-07-26 DIAGNOSIS — Z1152 Encounter for screening for COVID-19: Secondary | ICD-10-CM | POA: Diagnosis not present

## 2020-07-26 DIAGNOSIS — H6692 Otitis media, unspecified, left ear: Secondary | ICD-10-CM

## 2020-07-26 DIAGNOSIS — R0981 Nasal congestion: Secondary | ICD-10-CM

## 2020-07-26 DIAGNOSIS — J029 Acute pharyngitis, unspecified: Secondary | ICD-10-CM

## 2020-07-26 DIAGNOSIS — R52 Pain, unspecified: Secondary | ICD-10-CM

## 2020-07-26 MED ORDER — AMOXICILLIN 500 MG PO TABS: 500.0000 mg | ORAL_TABLET | Freq: Two times a day (BID) | ORAL | 0 refills | Status: AC

## 2020-07-26 NOTE — ED Triage Notes (Signed)
Pt presents with complaints of headache, nasal congestion, headache, sore throat and watery eyes. Reports symptoms have been going on x 2 days. Reports trying sudafed and tylenol cold and flu with no relief. Denies any fevers. Concerned for sinus infection. Pt is fully vaccinated for covid.

## 2020-07-26 NOTE — Discharge Instructions (Addendum)
Your COVID test is pending.  You should self quarantine until the test result is back.    Take Tylenol as needed for fever or discomfort.  Rest and keep yourself hydrated.    Go to the emergency department if you develop acute worsening symptoms.    I have sent in amoxicillin for your ear infection. Take one tablet twice a day for 10 days  Follow up if symptoms are persisting

## 2020-07-26 NOTE — ED Provider Notes (Signed)
[facility]   [id number] 07/26/20 Arrival Time: 2706   CC: COVID symptoms  SUBJECTIVE: History from: patient.  Jeanette Brown is a 44 y.o. female who presents with abrupt onset of nasal congestion, sinus pain, sore throat, watery eyes, PND, and persistent dry cough for 2 days. Also reports that she has been having left ear pain.Denies sick exposure to COVID, flu or strep. Denies recent travel. Has negative history of Covid. Has completed Covid vaccines. Has taken OTC medications for this without relief. There are no aggravating or alleviating factors. Denies previous symptoms in the past. Denies fever, chills, SOB, wheezing, chest pain, nausea, changes in bowel or bladder habits.    ROS: As per HPI.  All other pertinent ROS negative.     Past Medical History:  Diagnosis Date  . Allergic rhinitis due to pollen   . GERD (gastroesophageal reflux disease)    otc pepcid  . Hypertension    managed by pcp-Dr Willey Blade  . 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 Neutropenia, unspecified ([facility])   . Obesity, unspecified    Past Surgical History:  Procedure Laterality Date  . CESAREAN SECTION  03/26/2012   Procedure: CESAREAN SECTION;  Surgeon: Leslie Andrea, MD;  Location: [facility];  Service: Gynecology;  Laterality: N/A;  Primary Cesarean Section with birth of baby boy @ 5 Apgars 8/10  . WISDOM TOOTH EXTRACTION     No Known Allergies No current facility-administered medications on file prior to encounter.   Current Outpatient Medications on File Prior to Encounter  Medication Sig Dispense Refill  . amLODipine (NORVASC) 10 MG tablet Take 10 mg by mouth daily.    Marland Kitchen amphetamine-dextroamphetamine (ADDERALL) 20 MG tablet Take 20 mg by mouth daily.    Marland Kitchen atenolol (TENORMIN) 50 MG tablet Take 10 mg by mouth daily.    . cetirizine (ZYRTEC) 10 MG tablet Take 10 mg by mouth daily.    Marland Kitchen triamterene-hydrochlorothiazide (DYAZIDE) 37.5-25 MG per  capsule Take 1 capsule by mouth daily.    Marland Kitchen albuterol (VENTOLIN HFA) 108 (90 BASE) MCG/ACT inhaler Inhale 2 puffs into the lungs every 6 (six) hours as needed for wheezing or shortness of breath.    . budesonide (RHINOCORT ALLERGY) 32 MCG/ACT nasal spray Place 1 spray into both nostrils as needed.     . Calcium Carbonate-Vit D-Min (CALCIUM 1200 PO) Take 1 tablet by mouth daily.    Marland Kitchen EPINEPHrine 0.3 mg/0.3 mL IJ SOAJ injection Inject 0.3 mLs (0.3 mg total) into the muscle as needed (in the event of a severe allergic reaction). 2 Device 2  . ipratropium (ATROVENT) 0.03 % nasal spray Place 2 sprays into both nostrils every 12 (twelve) hours. 30 mL 0  . nebivolol (BYSTOLIC) 10 MG tablet Take 10 mg by mouth daily.    . Vitamin D, Ergocalciferol, (DRISDOL) 50000 UNITS CAPS capsule Take 50,000 Units by mouth every 7 (seven) days.     Social History   Socioeconomic History  . Marital status: Married    Spouse name: Not on file  . Number of children: Not on file  . Years of education: Not on file  . Highest education level: Not on file  Occupational History  . Not on file  Tobacco Use  . Smoking status: Never Smoker  . Smokeless tobacco: Never Used  Vaping Use  . Vaping Use: Never used  Substance and Sexual Activity  . Alcohol use: No  . Drug use: No  .  Sexual activity: Yes    Birth control/protection: None  Other Topics Concern  . Not on file  Social History Narrative  . Not on file   Social Determinants of Health   Financial Resource Strain:   . Difficulty of Paying Living Expenses: Not on file  Food Insecurity:   . Worried About Programme researcher, broadcasting/film/video in the Last Year: Not on file  . Ran Out of Food in the Last Year: Not on file  Transportation Needs:   . Lack of Transportation (Medical): Not on file  . Lack of Transportation (Non-Medical): Not on file  Physical Activity:   . Days of Exercise per Week: Not on file  . Minutes of Exercise per Session: Not on file  Stress:   .  Feeling of Stress : Not on file  Social Connections:   . Frequency of Communication with Friends and Family: Not on file  . Frequency of Social Gatherings with Friends and Family: Not on file  . Attends Religious Services: Not on file  . Active Member of Clubs or Organizations: Not on file  . Attends Banker Meetings: Not on file  . Marital Status: Not on file  Intimate Partner Violence:   . Fear of Current or Ex-Partner: Not on file  . Emotionally Abused: Not on file  . Physically Abused: Not on file  . Sexually Abused: Not on file   Family History  Problem Relation Age of Onset  . Hypertension Mother   . Hypertension Father   . Heart disease Father   . Allergies Brother     OBJECTIVE:  Vitals:   07/26/20 0855  BP: 123/86  Pulse: 81  Resp: 18  Temp: 99 F (37.2 C)  TempSrc: Oral  SpO2: 97%     General appearance: alert; appears fatigued, but nontoxic; speaking in full sentences and tolerating own secretions HEENT: NCAT; Ears: EACs clear, R TM pearly gray, L TM erythematous and bulging with effusion; Eyes: PERRL, watery with clear discharge  EOM grossly intact. Sinuses: nontender; Nose: nares patent with clear rhinorrhea, Throat: oropharynx erythematous, tonsils non erythematous or enlarged, uvula midline  Neck: supple with LAD Lungs: unlabored respirations, symmetrical air entry; cough: absent; no respiratory distress; CTAB Heart: regular rate and rhythm.  Radial pulses 2+ symmetrical bilaterally Skin: warm and dry Psychological: alert and cooperative; normal mood and affect  LABS:  No results found for this or any previous visit (from the past 24 hour(s)).   ASSESSMENT & PLAN:  1. Left otitis media, unspecified otitis media type   2. Encounter for screening for COVID-19   3. Viral illness   4. Nasal congestion   5. Acute pharyngitis, unspecified etiology   6. Aches     Meds ordered this encounter  Medications  . amoxicillin (AMOXIL) 500 MG  tablet    Sig: Take 1 tablet (500 mg total) by mouth 2 (two) times daily for 10 days.    Dispense:  20 tablet    Refill:  0    Order Specific Question:   Supervising Provider    Answer:   Merrilee Jansky [[id number]]   L OM Prescribed Amoxicillin  Highly suspicious of Covid as well given clinical presentation   COVID testing ordered.  It will take between 1-2 days for test results.  Someone will contact you regarding abnormal results.    Patient should remain in quarantine until they have received Covid results.  If negative you may resume normal activities (go back to  work/school) while practicing hand hygiene, social distance, and mask wearing.  If positive, patient should remain in quarantine for 10 days from symptom onset AND greater than 72 hours after symptoms resolution (absence of fever without the use of fever-reducing medication and improvement in respiratory symptoms), whichever is longer Get plenty of rest and push fluids Use OTC zyrtec for nasal congestion, runny nose, and/or sore throat Use OTC flonase for nasal congestion and runny nose Use medications daily for symptom relief Use OTC medications like ibuprofen or tylenol as needed fever or pain Call or go to the ED if you have any new or worsening symptoms such as fever, worsening cough, shortness of breath, chest tightness, chest pain, turning blue, changes in mental status.  Reviewed expectations re: course of current medical issues. Questions answered. Outlined signs and symptoms indicating need for more acute intervention. Patient verbalized understanding. After Visit Summary given.         Moshe Cipro, NP 07/26/20 [phone]

## 2020-07-27 ENCOUNTER — Other Ambulatory Visit: Payer: Self-pay

## 2020-07-29 ENCOUNTER — Telehealth: Payer: Self-pay

## 2020-07-29 LAB — NOVEL CORONAVIRUS, NAA: SARS-CoV-2, NAA: NOT DETECTED

## 2020-07-29 NOTE — Telephone Encounter (Signed)
Informed pt that results are not ready and that her MyChart app will inform her when test results are back.

## 2020-08-09 ENCOUNTER — Other Ambulatory Visit (INDEPENDENT_AMBULATORY_CARE_PROVIDER_SITE_OTHER): Payer: Self-pay | Admitting: Otolaryngology

## 2022-01-15 ENCOUNTER — Ambulatory Visit (HOSPITAL_COMMUNITY)
Admission: EM | Admit: 2022-01-15 | Discharge: 2022-01-15 | Disposition: A | Discharge status: Home / Self Care | Payer: 59 | Attending: Physician Assistant | Admitting: Physician Assistant

## 2022-01-15 ENCOUNTER — Other Ambulatory Visit: Payer: Self-pay

## 2022-01-15 ENCOUNTER — Encounter (HOSPITAL_COMMUNITY): Payer: Self-pay | Admitting: Emergency Medicine

## 2022-01-15 DIAGNOSIS — J4521 Mild intermittent asthma with (acute) exacerbation: Secondary | ICD-10-CM

## 2022-01-15 DIAGNOSIS — J40 Bronchitis, not specified as acute or chronic: Secondary | ICD-10-CM

## 2022-01-15 MED ORDER — ALBUTEROL SULFATE (2.5 MG/3ML) 0.083% IN NEBU
2.5000 mg | INHALATION_SOLUTION | Freq: Once | RESPIRATORY_TRACT | Status: AC
  Administered 2022-01-15: 2.5 mg via RESPIRATORY_TRACT

## 2022-01-15 MED ORDER — PREDNISONE 10 MG (21) PO TBPK: ORAL_TABLET | ORAL | 0 refills | Status: DC

## 2022-01-15 MED ORDER — ALBUTEROL SULFATE HFA 108 (90 BASE) MCG/ACT IN AERS: 1.0000 | INHALATION_SPRAY | Freq: Four times a day (QID) | RESPIRATORY_TRACT | 1 refills | Status: DC | PRN

## 2022-01-15 MED ORDER — METHYLPREDNISOLONE SODIUM SUCC 125 MG IJ SOLR
60.0000 mg | Freq: Once | INTRAMUSCULAR | Status: AC
  Administered 2022-01-15: 60 mg via INTRAMUSCULAR

## 2022-01-15 MED ORDER — METHYLPREDNISOLONE SODIUM SUCC 125 MG IJ SOLR
INTRAMUSCULAR | Status: AC
  Filled 2022-01-15: qty 2

## 2022-01-15 MED ORDER — ALBUTEROL SULFATE (2.5 MG/3ML) 0.083% IN NEBU
INHALATION_SOLUTION | RESPIRATORY_TRACT | Status: AC
  Filled 2022-01-15: qty 3

## 2022-01-15 MED ORDER — BENZONATATE 100 MG PO CAPS: 100.0000 mg | ORAL_CAPSULE | Freq: Three times a day (TID) | ORAL | 0 refills | Status: DC

## 2022-01-15 NOTE — ED Provider Notes (Signed)
[facility]    CSN: [id number] Arrival date & time: 01/15/22  0801      History   Chief Complaint Chief Complaint  Patient presents with   Cough    HPI Jeanette Brown is a 46 y.o. female.   Patient presents today with a 2+ week history of cough.  Reports associated nasal congestion.  She denies any fever, chest pain, shortness of breath, nausea, vomiting.  She has tried multiple over-the-counter medications including Mucinex without improvement of symptoms.  She does have a history of allergies and takes Xyzal daily.  She does not smoke.  She does have a history of exercise-induced asthma but has not required albuterol recently; does not have this prescription available and is requesting a refill for appropriate.  She did take an at-home COVID test when symptoms began that was negative.  She has had COVID-19 vaccination.  Denies any recent antibiotic or steroid use.  Symptoms are interfering with ability perform daily duties.  Denies history of diabetes.  She is confident that she is not pregnant.   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    Neutropenia, unspecified (HCC)    Obesity, unspecified     Patient Active Problem List   Diagnosis Date Noted   Morbid obesity (HCC) 07/28/2017   OSA (obstructive sleep apnea) 05/22/2017   Closed die-punch intra-articular fracture of distal radius 05/20/2016   Other allergic rhinitis 09/11/2015   Oral allergy syndrome 09/11/2015   Exercise-induced bronchospasm 09/11/2015   Dyspnea 09/11/2015   Pulmonary edema 04/02/2012   Hypertensive crisis 04/02/2012   Anemia due to blood loss, acute 03/31/2012   HTN (hypertension) 03/30/2012   Pre-eclampsia 03/30/2012   Fetal bradycardia affecting management of mother, delivered  03/26/2012   Status post cesarean section 03/26/2012    Past Surgical History:  Procedure Laterality Date   CESAREAN SECTION  03/26/2012   Procedure: CESAREAN SECTION;  Surgeon: Leslie Andrea, MD;  Location: [facility];  Service: Gynecology;  Laterality: N/A;  Primary Cesarean Section with birth of baby boy @ 50 Apgars 8/10   WISDOM TOOTH EXTRACTION      OB History     Gravida  4   Para  2   Term  1   Preterm  1   AB  2   Living  1      SAB  0   IAB  2   Ectopic  0   Multiple      Live Births  1            Home Medications    Prior to Admission medications   Medication Sig Start Date End Date Taking? Authorizing Provider  albuterol (VENTOLIN HFA) 108 (90 Base) MCG/ACT inhaler Inhale 1-2 puffs into the lungs every 6 (six) hours as needed for wheezing or shortness of breath. 01/15/22  Yes Axl Rodino K, PA-C  benzonatate (TESSALON) 100 MG capsule Take 1 capsule (100 mg total) by mouth every 8 (eight) hours. 01/15/22  Yes Axil Copeman K, PA-C  predniSONE (STERAPRED UNI-PAK 21 TAB) 10 MG (21) TBPK tablet As directed 01/15/22  Yes Riordan Walle K, PA-C  amLODipine (NORVASC) 10 MG tablet Take 10 mg by mouth daily.    [provider]  amphetamine-dextroamphetamine (ADDERALL) 20 MG tablet Take 20 mg by mouth daily.    [provider]  atenolol (TENORMIN) 50 MG tablet Take 10 mg by mouth daily.    [provider]  budesonide (RHINOCORT ALLERGY) 32 MCG/ACT nasal spray Place 1 spray into both nostrils as needed.     [provider]  Calcium Carbonate-Vit D-Min (CALCIUM 1200 PO) Take 1 tablet by mouth daily.    [provider]  cetirizine (ZYRTEC) 10 MG tablet Take 10 mg by mouth daily.    [provider]  EPINEPHrine 0.3 mg/0.3 mL IJ SOAJ injection Inject 0.3 mLs (0.3 mg total) into the muscle as needed (in the event of a severe allergic reaction). 06/06/16   Baxter Hire, MD  ipratropium (ATROVENT) 0.03 % nasal  spray Place 2 sprays into both nostrils every 12 (twelve) hours. 02/19/17   Mikell, Antionette Poles, MD  nebivolol (BYSTOLIC) 10 MG tablet Take 10 mg by mouth daily.    [provider]  triamterene-hydrochlorothiazide (DYAZIDE) 37.5-25 MG per capsule Take 1 capsule by mouth daily.    [provider]  Vitamin D, Ergocalciferol, (DRISDOL) 50000 UNITS CAPS capsule Take 50,000 Units by mouth every 7 (seven) days.    [provider]    Family History Family History  Problem Relation Age of Onset   Hypertension Mother    Hypertension Father    Heart disease Father    Allergies Brother     Social History Social History   Tobacco Use   Smoking status: Never   Smokeless tobacco: Never  Vaping Use   Vaping Use: Never used  Substance Use Topics   Alcohol use: No   Drug use: No     Allergies   Patient has no known allergies.   Review of Systems Review of Systems  Constitutional:  Positive for activity change and fatigue. Negative for appetite change and fever.  HENT:  Positive for congestion, postnasal drip and sore throat. Negative for sinus pressure and sneezing.   Respiratory:  Positive for cough, chest tightness and shortness of breath.   Cardiovascular:  Negative for chest pain.  Gastrointestinal:  Negative for abdominal pain, diarrhea, nausea and vomiting.  Neurological:  Positive for headaches. Negative for dizziness and light-headedness.    Physical Exam Triage Vital Signs ED Triage Vitals  Enc Vitals Group     BP 01/15/22 0814 (!) 132/91     Pulse Rate 01/15/22 0814 (!) 107     Resp 01/15/22 0814 18     Temp 01/15/22 0814 98.7 F (37.1 C)     Temp Source 01/15/22 0814 Oral     SpO2 01/15/22 0814 98 %     Weight --      Height --      Head Circumference --      Peak Flow --      Pain Score 01/15/22 0813 0     Pain Loc --      Pain Edu? --      Excl. in GC? --    No data found.  Updated Vital Signs BP (!) 132/91 (BP Location: Left  Arm)    Pulse (!) 107    Temp 98.7 F (37.1 C) (Oral)    Resp 18    SpO2 99%   Visual Acuity Right Eye Distance:   Left Eye Distance:   Bilateral Distance:    Right Eye Near:   Left Eye Near:    Bilateral Near:  Physical Exam Vitals reviewed.  Constitutional:      General: She is awake. She is not in acute distress.    Appearance: Normal appearance. She is well-developed. She is not ill-appearing.     Comments: Very pleasant female appears stated age in no acute distress  HENT:     Head: Normocephalic and atraumatic.     Right Ear: Tympanic membrane, ear canal and external ear normal. Tympanic membrane is not erythematous or bulging.     Left Ear: Tympanic membrane, ear canal and external ear normal. Tympanic membrane is not erythematous or bulging.     Nose:     Right Sinus: No maxillary sinus tenderness or frontal sinus tenderness.     Left Sinus: No maxillary sinus tenderness or frontal sinus tenderness.     Mouth/Throat:     Pharynx: Uvula midline. Posterior oropharyngeal erythema present. No oropharyngeal exudate.  Cardiovascular:     Rate and Rhythm: Normal rate and regular rhythm.     Heart sounds: Normal heart sounds, S1 normal and S2 normal. No murmur heard. Pulmonary:     Effort: Pulmonary effort is normal.     Breath sounds: Normal breath sounds. No wheezing, rhonchi or rales.     Comments: Reactive cough with deep breathing Psychiatric:        Behavior: Behavior is cooperative.     UC Treatments / Results  Labs (all labs ordered are listed, but only abnormal results are displayed) Labs Reviewed - No data to display  EKG   Radiology No results found.  Procedures Procedures (including critical care time)  Medications Ordered in UC Medications  albuterol (PROVENTIL) (2.5 MG/3ML) 0.083% nebulizer solution 2.5 mg (2.5 mg Nebulization Given 01/15/22 0838)  methylPREDNISolone sodium succinate (SOLU-MEDROL) 125 mg/2 mL injection 60 mg (60 mg Intramuscular  Given 01/15/22 0838)    Initial Impression / Assessment and Plan / UC Course  I have reviewed the triage vital signs and the nursing notes.  Pertinent labs & imaging results that were available during my care of the patient were reviewed by me and considered in my medical decision making (see chart for details).     No indication for viral testing as patient has been symptomatic for several weeks and this would not change management.  No evidence of acute infection on physical exam that would warrant initiation of antibiotics.  Patient had improvement of symptoms with albuterol and Solu-Medrol in clinic.  Believe that asthma exacerbation is contributing to symptoms causing bronchitis.  She was sent in an inhaler for albuterol with instruction to use this on a scheduled basis for the next 24 hours then decrease use to as needed thereafter.  She was given Tessalon for cough.  Recommended prednisone taper for additional symptom relief.  Discussed that she is not to take NSAIDs with this medication due to risk of GI bleeding.  She can use Mucinex, Tylenol, Flonase for additional symptom relief.  She is to rest and drink plenty of fluid.  Discussed alarm symptoms that warrant emergent evaluation including chest pain, shortness of breath, nausea/vomiting interfering with oral intake, fever.  Discussed that if symptoms are not improving with medication regimen she should follow-up with our clinic or PCP for reevaluation.  Strict return precautions given to which she expressed understanding.  Final Clinical Impressions(s) / UC Diagnoses   Final diagnoses:  Mild intermittent asthma with acute exacerbation  Bronchitis     Discharge Instructions      We are treating you for an  asthma flare.  Please start prednisone tomorrow.  Do not take NSAIDs including aspirin, ibuprofen/Advil, naproxen/Aleve with this medication.  You can use Tylenol, Mucinex, Flonase for symptom relief.  Use Tessalon for cough.  Use  your albuterol inhaler on a scheduled basis for the next day and then decrease use to as needed thereafter.  If your symptoms are not improving by next week please return here or see your PCP.  If you have any severe symptoms including chest pain, shortness of breath, weakness, nausea/vomiting interfering with oral intake, worsening cough you need to go to seen immediately.     ED Prescriptions     Medication Sig Dispense Auth. Provider   albuterol (VENTOLIN HFA) 108 (90 Base) MCG/ACT inhaler Inhale 1-2 puffs into the lungs every 6 (six) hours as needed for wheezing or shortness of breath. 8 g Laiylah Roettger K, PA-C   predniSONE (STERAPRED UNI-PAK 21 TAB) 10 MG (21) TBPK tablet As directed 21 tablet Oluwaseyi Raffel K, PA-C   benzonatate (TESSALON) 100 MG capsule Take 1 capsule (100 mg total) by mouth every 8 (eight) hours. 21 capsule Lekesha Claw K, PA-C      PDMP not reviewed this encounter.   Jeani Hawking, PA-C 01/15/22 1540

## 2022-01-15 NOTE — ED Triage Notes (Signed)
Pt reports cough for two weeks that is non-productive. At night laying down causes worsening cough. Reports has post nasal drip.

## 2022-01-15 NOTE — Discharge Instructions (Signed)
We are treating you for an asthma flare.  Please start prednisone tomorrow.  Do not take NSAIDs including aspirin, ibuprofen/Advil, naproxen/Aleve with this medication.  You can use Tylenol, Mucinex, Flonase for symptom relief.  Use Tessalon for cough.  Use your albuterol inhaler on a scheduled basis for the next day and then decrease use to as needed thereafter.  If your symptoms are not improving by next week please return here or see your PCP.  If you have any severe symptoms including chest pain, shortness of breath, weakness, nausea/vomiting interfering with oral intake, worsening cough you need to go to seen immediately.

## 2022-03-18 ENCOUNTER — Other Ambulatory Visit: Payer: Self-pay | Admitting: Obstetrics and Gynecology

## 2022-03-18 DIAGNOSIS — R928 Other abnormal and inconclusive findings on diagnostic imaging of breast: Secondary | ICD-10-CM

## 2022-04-07 ENCOUNTER — Ambulatory Visit: Payer: 59

## 2022-04-07 ENCOUNTER — Ambulatory Visit
Admission: RE | Admit: 2022-04-07 | Discharge: 2022-04-07 | Disposition: A | Discharge status: Home / Self Care | Payer: 59 | Source: Ambulatory Visit | Attending: Obstetrics and Gynecology | Admitting: Obstetrics and Gynecology

## 2022-04-07 DIAGNOSIS — R928 Other abnormal and inconclusive findings on diagnostic imaging of breast: Secondary | ICD-10-CM

## 2022-08-12 ENCOUNTER — Other Ambulatory Visit (HOSPITAL_COMMUNITY): Payer: Self-pay

## 2022-08-13 ENCOUNTER — Other Ambulatory Visit (HOSPITAL_COMMUNITY): Payer: Self-pay

## 2022-08-13 MED ORDER — SAXENDA 18 MG/3ML ~~LOC~~ SOPN
PEN_INJECTOR | SUBCUTANEOUS | 3 refills | Status: DC
  Filled 2022-08-13: qty 6, 25d supply, fill #0

## 2022-08-14 ENCOUNTER — Other Ambulatory Visit (HOSPITAL_COMMUNITY): Payer: Self-pay

## 2022-08-14 MED ORDER — SAXENDA 18 MG/3ML ~~LOC~~ SOPN
3.0000 mg | PEN_INJECTOR | Freq: Every day | SUBCUTANEOUS | 3 refills | Status: DC
  Filled 2022-08-14: qty 15, 30d supply, fill #0
  Filled 2022-09-09: qty 15, 30d supply, fill #1
  Filled 2022-10-28: qty 15, 30d supply, fill #2

## 2022-08-15 ENCOUNTER — Other Ambulatory Visit (HOSPITAL_COMMUNITY): Payer: Self-pay

## 2022-08-15 MED ORDER — NOVOFINE PEN NEEDLE 32G X 6 MM MISC
11 refills | Status: DC
  Filled 2022-08-15: qty 100, 31d supply, fill #0
  Filled 2022-09-09: qty 100, 31d supply, fill #1
  Filled 2022-10-28 – 2022-10-29 (×2): qty 100, 31d supply, fill #2

## 2022-09-09 ENCOUNTER — Other Ambulatory Visit (HOSPITAL_COMMUNITY): Payer: Self-pay

## 2022-09-12 ENCOUNTER — Other Ambulatory Visit (HOSPITAL_COMMUNITY): Payer: Self-pay

## 2022-10-21 ENCOUNTER — Emergency Department (HOSPITAL_BASED_OUTPATIENT_CLINIC_OR_DEPARTMENT_OTHER)
Admission: EM | Admit: 2022-10-21 | Discharge: 2022-10-21 | Disposition: A | Discharge status: Home / Self Care | Payer: 59 | Attending: Emergency Medicine | Admitting: Emergency Medicine

## 2022-10-21 ENCOUNTER — Emergency Department (HOSPITAL_BASED_OUTPATIENT_CLINIC_OR_DEPARTMENT_OTHER): Payer: 59

## 2022-10-21 ENCOUNTER — Other Ambulatory Visit: Payer: Self-pay

## 2022-10-21 ENCOUNTER — Encounter (HOSPITAL_BASED_OUTPATIENT_CLINIC_OR_DEPARTMENT_OTHER): Payer: Self-pay | Admitting: Emergency Medicine

## 2022-10-21 ENCOUNTER — Emergency Department (HOSPITAL_BASED_OUTPATIENT_CLINIC_OR_DEPARTMENT_OTHER): Payer: 59 | Admitting: Radiology

## 2022-10-21 DIAGNOSIS — Z79899 Other long term (current) drug therapy: Secondary | ICD-10-CM | POA: Insufficient documentation

## 2022-10-21 DIAGNOSIS — I10 Essential (primary) hypertension: Secondary | ICD-10-CM | POA: Insufficient documentation

## 2022-10-21 DIAGNOSIS — M79605 Pain in left leg: Secondary | ICD-10-CM | POA: Diagnosis not present

## 2022-10-21 DIAGNOSIS — R079 Chest pain, unspecified: Secondary | ICD-10-CM | POA: Insufficient documentation

## 2022-10-21 LAB — CBC WITH DIFFERENTIAL/PLATELET
Abs Immature Granulocytes: 0.02 10*3/uL (ref 0.00–0.07)
Basophils Absolute: 0 10*3/uL (ref 0.0–0.1)
Basophils Relative: 1 %
Eosinophils Absolute: 0 10*3/uL (ref 0.0–0.5)
Eosinophils Relative: 1 %
HCT: 39.7 % (ref 36.0–46.0)
Hemoglobin: 12.9 g/dL (ref 12.0–15.0)
Immature Granulocytes: 1 %
Lymphocytes Relative: 47 %
Lymphs Abs: 1.8 10*3/uL (ref 0.7–4.0)
MCH: 25.7 pg — ABNORMAL LOW (ref 26.0–34.0)
MCHC: 32.5 g/dL (ref 30.0–36.0)
MCV: 79.2 fL — ABNORMAL LOW (ref 80.0–100.0)
Monocytes Absolute: 0.3 10*3/uL (ref 0.1–1.0)
Monocytes Relative: 7 %
Neutro Abs: 1.6 10*3/uL — ABNORMAL LOW (ref 1.7–7.7)
Neutrophils Relative %: 43 %
Platelets: 442 10*3/uL — ABNORMAL HIGH (ref 150–400)
RBC: 5.01 MIL/uL (ref 3.87–5.11)
RDW: 14.6 % (ref 11.5–15.5)
WBC: 3.7 10*3/uL — ABNORMAL LOW (ref 4.0–10.5)
nRBC: 0 % (ref 0.0–0.2)

## 2022-10-21 LAB — COMPREHENSIVE METABOLIC PANEL
ALT: 9 U/L (ref 0–44)
AST: 13 U/L — ABNORMAL LOW (ref 15–41)
Albumin: 4.4 g/dL (ref 3.5–5.0)
Alkaline Phosphatase: 46 U/L (ref 38–126)
Anion gap: 10 (ref 5–15)
BUN: 16 mg/dL (ref 6–20)
CO2: 26 mmol/L (ref 22–32)
Calcium: 9.6 mg/dL (ref 8.9–10.3)
Chloride: 103 mmol/L (ref 98–111)
Creatinine, Ser: 0.86 mg/dL (ref 0.44–1.00)
GFR, Estimated: >60 mL/min (ref >60)
Glucose, Bld: 104 mg/dL — ABNORMAL HIGH (ref 70–99)
Potassium: 3.6 mmol/L (ref 3.5–5.1)
Sodium: 139 mmol/L (ref 135–145)
Total Bilirubin: 0.4 mg/dL (ref 0.3–1.2)
Total Protein: 7.2 g/dL (ref 6.5–8.1)

## 2022-10-21 LAB — TROPONIN I (HIGH SENSITIVITY): Troponin I (High Sensitivity): <2 ng/L (ref <18)

## 2022-10-21 LAB — MAGNESIUM: Magnesium: 2 mg/dL (ref 1.7–2.4)

## 2022-10-21 MED ORDER — IOHEXOL 350 MG/ML SOLN
100.0000 mL | Freq: Once | INTRAVENOUS | Status: AC | PRN
  Administered 2022-10-21: 75 mL via INTRAVENOUS

## 2022-10-21 NOTE — ED Notes (Signed)
Discharge instructions and follow up care reviewed and explained, pt verbalized understanding and had no further questions on d/c.  

## 2022-10-21 NOTE — ED Provider Notes (Signed)
[facility]   CSN: [id number] Arrival date & time: 10/21/22  1012     History  Chief Complaint  Patient presents with   Chest Pain   Leg Swelling    Jeanette Brown is a 46 y.o. female, history of hypertension, GERD, who presents to the ED secondary to chest pain that radiates to her back for the last 2 weeks after Jeanette Brown had her colonoscopy/endoscopy.  Jeanette Brown states they stretched her throat, and that was supposed to feel fixes her acid reflux, but now Jeanette Brown has chest pain between the breast that radiates to the back.  Jeanette Brown has no shortness of breath, nausea, vomiting.  Just a dull achy pain.  This has been going on for 2 weeks straight.  Jeanette Brown denies any abdominal pain, diarrhea, urinary symptoms.  Of note patient does not have pain when Jeanette Brown swallows.  Jeanette Brown is concerned because her left lower leg has been very painful for the last 6 months, states it felt like Jeanette Brown had a charley horse about 6 months ago, and since then it has become swollen and tender to the touch.  Jeanette Brown states it is mostly around her knee/calf area.  Denies any redness of the area.  Has not taken anything for the pain.     Home Medications Prior to Admission medications   Medication Sig Start Date End Date Taking? Authorizing Provider  albuterol (VENTOLIN HFA) 108 (90 Base) MCG/ACT inhaler Inhale 1-2 puffs into the lungs every 6 (six) hours as needed for wheezing or shortness of breath. 01/15/22   Raspet, Noberto RetortErin K, PA-C  amLODipine (NORVASC) 10 MG tablet Take 10 mg by mouth daily.    [provider]  amphetamine-dextroamphetamine (ADDERALL) 20 MG tablet Take 20 mg by mouth daily.    [provider]  atenolol (TENORMIN) 50 MG tablet Take 10 mg by mouth daily.    [provider]  benzonatate (TESSALON) 100 MG capsule Take 1 capsule (100 mg total) by mouth every 8 (eight) hours. 01/15/22   Raspet, Denny PeonErin K, PA-C  budesonide (RHINOCORT ALLERGY) 32 MCG/ACT  nasal spray Place 1 spray into both nostrils as needed.     [provider]  Calcium Carbonate-Vit D-Min (CALCIUM 1200 PO) Take 1 tablet by mouth daily.    [provider]  cetirizine (ZYRTEC) 10 MG tablet Take 10 mg by mouth daily.    [provider]  EPINEPHrine 0.3 mg/0.3 mL IJ SOAJ injection Inject 0.3 mLs (0.3 mg total) into the muscle as needed (in the event of a severe allergic reaction). 06/06/16   Baxter HireHicks, Roselyn M, MD  Insulin Pen Needle (NOVOFINE PEN NEEDLE) 32G X 6 MM MISC Use daily as directed 08/15/22     ipratropium (ATROVENT) 0.03 % nasal spray Place 2 sprays into both nostrils every 12 (twelve) hours. 02/19/17   Mikell, Antionette PolesAsiyah Zahra, MD  Liraglutide -Weight Management (SAXENDA) 18 MG/3ML SOPN Inject 3 mg into the skin daily. 08/14/22     nebivolol (BYSTOLIC) 10 MG tablet Take 10 mg by mouth daily.    [provider]  predniSONE (STERAPRED UNI-PAK 21 TAB) 10 MG (21) TBPK tablet As directed 01/15/22   Raspet, Erin K, PA-C  triamterene-hydrochlorothiazide (DYAZIDE) 37.5-25 MG per capsule Take 1 capsule by mouth daily.    [provider]  Vitamin D, Ergocalciferol, (DRISDOL) 50000 UNITS CAPS capsule Take 50,000 Units by mouth every 7 (seven) days.    [provider]      Allergies  Patient has no known allergies.    Review of Systems   Review of Systems  Respiratory:  Negative for shortness of breath.   Cardiovascular:  Positive for chest pain and leg swelling.  Gastrointestinal:  Negative for diarrhea, nausea and vomiting.    Physical Exam Updated Vital Signs BP (!) 134/90   Pulse 64   Temp 97.8 F (36.6 C) (Oral)   Resp 13   Ht 5' 3.5" (1.613 m)   Wt 81.6 kg   SpO2 97%   BMI 31.39 kg/m  Physical Exam Vitals and nursing note reviewed.  Constitutional:      General: Jeanette Brown is not in acute distress.    Appearance: Jeanette Brown is well-developed.  HENT:     Head: Normocephalic and atraumatic.  Eyes:     Conjunctiva/sclera:  Conjunctivae normal.  Cardiovascular:     Rate and Rhythm: Normal rate and regular rhythm.     Heart sounds: No murmur heard.    Comments: Minimal edema of the left lower extremity, however venous tenderness to palpation along calf veins, and popliteal area. Non erythematous Pulmonary:     Effort: Pulmonary effort is normal. No respiratory distress.     Breath sounds: Normal breath sounds.  Abdominal:     Palpations: Abdomen is soft.     Tenderness: There is no abdominal tenderness.  Musculoskeletal:        General: No swelling.     Cervical back: Neck supple.     Comments: Ttp between patient's breast and mid thoracic spine.   Skin:    General: Skin is warm and dry.     Capillary Refill: Capillary refill takes less than 2 seconds.  Neurological:     Mental Status: Jeanette Brown is alert.  Psychiatric:        Mood and Affect: Mood normal.     ED Results / Procedures / Treatments   Labs (all labs ordered are listed, but only abnormal results are displayed) Labs Reviewed  CBC WITH DIFFERENTIAL/PLATELET - Abnormal; Notable for the following components:      Result Value   WBC 3.7 (*)    MCV 79.2 (*)    MCH 25.7 (*)    Platelets 442 (*)    Neutro Abs 1.6 (*)    All other components within normal limits  COMPREHENSIVE METABOLIC PANEL - Abnormal; Notable for the following components:   Glucose, Bld 104 (*)    AST 13 (*)    All other components within normal limits  MAGNESIUM  TROPONIN I (HIGH SENSITIVITY)    EKG EKG Interpretation  Date/Time:  Tuesday October 21 2022 10:21:40 EST Ventricular Rate:  67 PR Interval:  155 QRS Duration: 90 QT Interval:  426 QTC Calculation: 450 R Axis:   12 Text Interpretation: Sinus rhythm nonspecific ST segments are similar to 2013 Confirmed by Pricilla Loveless [phone]) on 10/21/2022 10:28:00 AM  Radiology CT Angio Chest PE W and/or Wo Contrast  Result Date: 10/21/2022 CLINICAL DATA:  Chest pain x3 days EXAM: CT ANGIOGRAPHY CHEST WITH  CONTRAST TECHNIQUE: Multidetector CT imaging of the chest was performed using the standard protocol during bolus administration of intravenous contrast. Multiplanar CT image reconstructions and MIPs were obtained to evaluate the vascular anatomy. RADIATION DOSE REDUCTION: This exam was performed according to the departmental dose-optimization program which includes automated exposure control, adjustment of the mA and/or kV according to patient size and/or use of iterative reconstruction technique. CONTRAST:  55mL OMNIPAQUE IOHEXOL 350 MG/ML SOLN COMPARISON:  03/30/2012 FINDINGS: Cardiovascular:  There are no intraluminal filling defects in pulmonary artery branches. There is homogeneous enhancement in thoracic aorta. Mediastinum/Nodes: No significant lymphadenopathy seen. Lungs/Pleura: There is no focal pulmonary consolidation. There is no pleural effusion or pneumothorax. Minimal scarring is seen in apical regions. Upper Abdomen: Alamin Mccuiston hiatal hernia is seen. Musculoskeletal: Unremarkable. Review of the MIP images confirms the above findings. IMPRESSION: There is no evidence of pulmonary artery embolism. There is no evidence of thoracic aortic dissection. There is no focal pulmonary consolidation. Kayleana Waites hiatal hernia is seen. Electronically Signed   By: Ernie Avena M.D.   On: 10/21/2022 12:08   US Venous Img Lower Unilateral Left (DVT)  Result Date: 10/21/2022 CLINICAL DATA:  LEFT leg pain since Friday. EXAM: LEFT LOWER EXTREMITY VENOUS DOPPLER ULTRASOUND TECHNIQUE: Gray-scale sonography with compression, as well as color and duplex ultrasound, were performed to evaluate the deep venous system(s) from the level of the common femoral vein through the popliteal and proximal calf veins. COMPARISON:  None Available. FINDINGS: VENOUS Normal compressibility of the LEFT common femoral, superficial femoral, and popliteal veins, as well as the visualized calf veins. Visualized portions of profunda femoral vein  and great saphenous vein unremarkable. No filling defects to suggest DVT on grayscale or color Doppler imaging. Doppler waveforms show normal direction of venous flow, normal respiratory plasticity and response to augmentation. Limited views of the contralateral common femoral vein are unremarkable. OTHER No evidence of superficial thrombophlebitis or abnormal fluid collection. Limitations: none IMPRESSION: No evidence of femoropopliteal DVT or superficial thrombophlebitis within the LEFT lower extremity. Roanna Banning, MD [facility] Electronically Signed   By: Roanna Banning M.D.   On: 10/21/2022 11:55    Procedures Procedures    Medications Ordered in ED Medications  iohexol (OMNIPAQUE) 350 MG/ML injection 100 mL (75 mLs Intravenous Contrast Given 10/21/22 1151)    ED Course/ Medical Decision Making/ A&P                           Medical Decision Making Patient is a 46 year old female, here for pain of her leg for the last few months, on exam Jeanette Brown does have an venous tenderness to palpation so we will obtain an ultrasound of this, Jeanette Brown has no erythema however, and denies any trauma.  No tenderness to palpation of the bones.  Typically is mostly venous.  Additionally is here for chest pain that occurred after her endoscopy/colonoscopy combo.  Jeanette Brown has no crepitus on exam, but given lower extremity pain, and chest pain we will obtain a CTA for further evaluation.  Additionally will obtain troponins for further evaluation.  Jeanette Brown is well-appearing, not having any current chest pain, and does not hurt to swallow.  Amount and/or Complexity of Data Reviewed Labs: ordered.    Details: Labs fairly unremarkable, normal troponins, no elevated white blood cell count. Radiology: ordered.    Details: Ultrasound is unremarkable, no findings of DVT.  CTA does not show any free air, or any blood clots. ECG/medicine tests:  Decision-making details documented  in ED Course. Discussion of management or test interpretation with external provider(s): Discussed with patient, chest pain likely etiology from dilation of her esophagus causing some discomfort.  Jeanette Brown has no crepitus or free air visualized on the CTA.  And her troponins were within normal limits.  No acute findings on EKG.  Also I discussed the swelling of the leg,/spasms.  Her magnesium is normal, and on the ultrasound, there  is no acute any findings.  Jeanette Brown was advised to follow-up with her primary care doctor.  Risk Prescription drug management.    Final Clinical Impression(s) / ED Diagnoses Final diagnoses:  Left leg pain  Chest pain, unspecified type    Rx / DC Orders ED Discharge Orders     None         Pete Pelt, PA 10/21/22 1924    Pricilla Loveless, MD 10/27/22 [phone]

## 2022-10-21 NOTE — ED Triage Notes (Signed)
Pt arrives to ED with c/o chest pain that started x3 days ago and left leg pain x6 months. The leg pain is described as a cramp and chest pain as dull.

## 2022-10-21 NOTE — Discharge Instructions (Signed)
Please follow-up with your primary care doctor.  Your chest pain may be secondary to dilation from your endoscopy, but if you develop worsening chest pain, severe shortness of breath please return to the ER.

## 2022-10-28 ENCOUNTER — Other Ambulatory Visit (HOSPITAL_COMMUNITY): Payer: Self-pay

## 2022-10-29 ENCOUNTER — Other Ambulatory Visit (HOSPITAL_COMMUNITY): Payer: Self-pay

## 2022-11-07 ENCOUNTER — Ambulatory Visit
Admission: RE | Admit: 2022-11-07 | Discharge: 2022-11-07 | Disposition: A | Discharge status: Home / Self Care | Payer: 59 | Source: Ambulatory Visit | Attending: Internal Medicine | Admitting: Internal Medicine

## 2022-11-07 ENCOUNTER — Other Ambulatory Visit: Payer: Self-pay | Admitting: Internal Medicine

## 2022-11-07 DIAGNOSIS — R079 Chest pain, unspecified: Secondary | ICD-10-CM

## 2022-12-08 ENCOUNTER — Ambulatory Visit
Admission: RE | Admit: 2022-12-08 | Discharge: 2022-12-08 | Disposition: A | Discharge status: Home / Self Care | Payer: 59 | Source: Ambulatory Visit | Attending: Gastroenterology | Admitting: Gastroenterology

## 2022-12-08 ENCOUNTER — Other Ambulatory Visit: Payer: Self-pay | Admitting: Gastroenterology

## 2022-12-08 DIAGNOSIS — R197 Diarrhea, unspecified: Secondary | ICD-10-CM

## 2023-01-20 ENCOUNTER — Encounter: Payer: Self-pay | Admitting: Obstetrics and Gynecology

## 2023-05-13 ENCOUNTER — Other Ambulatory Visit (HOSPITAL_COMMUNITY): Payer: Self-pay

## 2023-05-13 MED ORDER — SEMAGLUTIDE-WEIGHT MANAGEMENT 1.7 MG/0.75ML ~~LOC~~ SOAJ
1.7000 mg | SUBCUTANEOUS | 3 refills | Status: DC
  Filled 2023-05-13: qty 3, 28d supply, fill #0
  Filled 2023-06-05: qty 3, 28d supply, fill #1
  Filled 2023-07-02: qty 3, 28d supply, fill #2
  Filled 2023-08-04: qty 3, 28d supply, fill #3

## 2023-07-02 ENCOUNTER — Other Ambulatory Visit (HOSPITAL_COMMUNITY): Payer: Self-pay

## 2023-08-21 ENCOUNTER — Other Ambulatory Visit: Payer: Self-pay | Admitting: Family

## 2023-08-21 DIAGNOSIS — N6310 Unspecified lump in the right breast, unspecified quadrant: Secondary | ICD-10-CM

## 2023-08-29 ENCOUNTER — Other Ambulatory Visit (HOSPITAL_COMMUNITY): Payer: Self-pay

## 2023-08-29 MED ORDER — SEMAGLUTIDE-WEIGHT MANAGEMENT 1.7 MG/0.75ML ~~LOC~~ SOAJ
1.7000 mg | SUBCUTANEOUS | 3 refills | Status: DC
  Filled 2023-08-29: qty 3, 28d supply, fill #0
  Filled 2023-10-27: qty 3, 28d supply, fill #1
  Filled 2023-11-20: qty 3, 28d supply, fill #2
  Filled 2023-12-22: qty 3, 28d supply, fill #3

## 2023-08-31 ENCOUNTER — Other Ambulatory Visit (HOSPITAL_COMMUNITY): Payer: Self-pay

## 2023-08-31 ENCOUNTER — Other Ambulatory Visit: Payer: 59

## 2023-09-01 ENCOUNTER — Other Ambulatory Visit (HOSPITAL_COMMUNITY): Payer: Self-pay

## 2023-09-02 ENCOUNTER — Other Ambulatory Visit (HOSPITAL_COMMUNITY): Payer: Self-pay

## 2023-09-02 MED ORDER — AMPHETAMINE-DEXTROAMPHET ER 30 MG PO CP24
30.0000 mg | ORAL_CAPSULE | Freq: Every day | ORAL | 0 refills | Status: DC
  Filled 2023-09-02: qty 30, 30d supply, fill #0

## 2023-09-07 ENCOUNTER — Ambulatory Visit
Admission: RE | Admit: 2023-09-07 | Discharge: 2023-09-07 | Disposition: A | Discharge status: Home / Self Care | Payer: 59 | Source: Ambulatory Visit | Attending: Family | Admitting: Family

## 2023-09-07 DIAGNOSIS — N6310 Unspecified lump in the right breast, unspecified quadrant: Secondary | ICD-10-CM

## 2023-10-27 ENCOUNTER — Other Ambulatory Visit (HOSPITAL_COMMUNITY): Payer: Self-pay

## 2023-11-05 ENCOUNTER — Encounter: Payer: Self-pay | Admitting: Allergy

## 2023-11-05 ENCOUNTER — Other Ambulatory Visit: Payer: Self-pay

## 2023-11-05 ENCOUNTER — Ambulatory Visit: Payer: 59 | Admitting: Allergy

## 2023-11-05 VITALS — BP 122/92 | HR 86 | Temp 98.1°F | Resp 16 | Ht 64.5 in | Wt 172.8 lb

## 2023-11-05 DIAGNOSIS — H1013 Acute atopic conjunctivitis, bilateral: Secondary | ICD-10-CM | POA: Diagnosis not present

## 2023-11-05 DIAGNOSIS — T7800XD Anaphylactic reaction due to unspecified food, subsequent encounter: Secondary | ICD-10-CM | POA: Diagnosis not present

## 2023-11-05 DIAGNOSIS — J4520 Mild intermittent asthma, uncomplicated: Secondary | ICD-10-CM

## 2023-11-05 DIAGNOSIS — H01134 Eczematous dermatitis of left upper eyelid: Secondary | ICD-10-CM

## 2023-11-05 DIAGNOSIS — H01131 Eczematous dermatitis of right upper eyelid: Secondary | ICD-10-CM

## 2023-11-05 DIAGNOSIS — J3089 Other allergic rhinitis: Secondary | ICD-10-CM | POA: Diagnosis not present

## 2023-11-05 DIAGNOSIS — H01132 Eczematous dermatitis of right lower eyelid: Secondary | ICD-10-CM

## 2023-11-05 DIAGNOSIS — H01135 Eczematous dermatitis of left lower eyelid: Secondary | ICD-10-CM

## 2023-11-05 MED ORDER — EPINEPHRINE 0.3 MG/0.3ML IJ SOAJ: 0.3000 mg | INTRAMUSCULAR | 1 refills | Status: DC | PRN

## 2023-11-05 MED ORDER — CARBINOXAMINE MALEATE 6 MG PO TABS: 1.0000 | ORAL_TABLET | Freq: Two times a day (BID) | ORAL | 5 refills | Status: DC

## 2023-11-05 MED ORDER — RYALTRIS 665-25 MCG/ACT NA SUSP: 2.0000 | Freq: Two times a day (BID) | NASAL | 5 refills | Status: DC

## 2023-11-05 MED ORDER — ALBUTEROL SULFATE HFA 108 (90 BASE) MCG/ACT IN AERS: 1.0000 | INHALATION_SPRAY | Freq: Four times a day (QID) | RESPIRATORY_TRACT | 1 refills | Status: DC | PRN

## 2023-11-05 MED ORDER — MONTELUKAST SODIUM 10 MG PO TABS: 10.0000 mg | ORAL_TABLET | Freq: Every day | ORAL | 5 refills | Status: DC

## 2023-11-05 MED ORDER — BEPOTASTINE BESILATE 1.5 % OP SOLN: 1.0000 [drp] | Freq: Two times a day (BID) | OPHTHALMIC | 5 refills | Status: DC | PRN

## 2023-11-05 NOTE — Progress Notes (Addendum)
New Patient Note  RE: Jeanette Brown MRN: [id number] DOB: [birth year] Date of Office Visit: 11/05/2023  Primary care provider: Gwenyth Bender, MD  Chief Complaint: allergies  History of present illness: Jeanette Brown is a 47 y.o. female presenting today for evaluation of allergic rhinitis, food allergy.  She is a patient of the practice last seeing Dr. Willa Rough, who is no longer with the practice, in 2017.  She did have a several year stint on allergen immunotherapy at the time.    Discussed the use of AI scribe software for clinical note transcription with the patient, who gave verbal consent to proceed.  She presents with worsening symptoms. She reports a year-round persistence of symptoms, previously only experienced seasonally from January to June. The primary complaint is severe ocular pruritus (eye itself and eye lids), which has been a consistent issue over the years. The patient also reports generalized itching, even in response to topical application of Vaseline.  The patient's symptoms have evolved to include food allergies, specifically to peanuts and crab legs, which cause swelling, itching, and nausea. These reactions are new, as the patient was previously able to consume these foods without issue. The patient also reports an increase in mucus production after consuming tree nuts, such as walnuts and almonds.  In addition to ocular and skin symptoms, the patient experiences nasal congestion and drainage, which vary in severity. She manages these symptoms with daily nasal rinses and the use of Flonase. During allergy season, the patient reports needing to take up to four Xyzal daily, in addition to montelukast.  The patient has a history of wheezing, which she believes is triggered by allergen exposure. This is not a constant issue, but rather occurs when the patient is heavily impacted by allergens. During these periods, the patient uses an albuterol inhaler  approximately every other day.  The patient has previously undergone allergy shots for a couple of years but did not notice a significant reduction in medication need during this time. She has also tried various topical treatments for her ocular symptoms, including Pataday, but reports minimal relief.    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    Neutropenia, unspecified (HCC)    Obesity, unspecified    Recurrent upper respiratory infection (URI)    Urticaria     Past surgical history: Past Surgical History:  Procedure Laterality Date   BREAST BIOPSY Right 03/13/2020   CESAREAN SECTION  03/26/2012   Procedure: CESAREAN SECTION;  Surgeon: Leslie Andrea, MD;  Location: [facility];  Service: Gynecology;  Laterality: N/A;  Primary Cesarean Section with birth of baby boy @ 33 Apgars 43/10   WISDOM TOOTH EXTRACTION      Family history:  Family History  Problem Relation Age of Onset   Allergic rhinitis Mother    Hypertension Mother    Hypertension Father    Heart disease Father    Allergic rhinitis Brother    Allergies Brother    Urticaria Son    Asthma Son    Angioedema Son    Allergic rhinitis Son    Urticaria  Son    Angioedema Son    Allergic rhinitis Son    Eczema Neg Hx     Social history: Lives in a home without carpeting with gas heating and central cooling.  Dog in the home.  There is no concern for water damage, mildew or roaches in the home.  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  She denies a smoking history.  Medication List: Current Outpatient Medications  Medication Sig Dispense Refill   amphetamine-dextroamphetamine (ADDERALL XR) 30  MG 24 hr capsule Take 1 capsule (30 mg total) by mouth daily. 30 capsule 0   atenolol (TENORMIN) 50 MG tablet Take 10 mg by mouth daily.     cimetidine (TAGAMET) 300 MG tablet Take 300 mg by mouth daily.     cyanocobalamin (VITAMIN B12) 500 MCG tablet Take 500 mcg by mouth daily.     Docusate Sodium (DSS) 100 MG CAPS Take 100 mg by mouth daily.     fluticasone (FLONASE) 50 MCG/ACT nasal spray Place 2 sprays into both nostrils daily.     levocetirizine (XYZAL) 5 MG tablet Take 5 mg by mouth every evening.     losartan-hydrochlorothiazide (HYZAAR) 100-25 MG tablet Take 1 tablet by mouth daily.     montelukast (SINGULAIR) 10 MG tablet Take 10 mg by mouth at bedtime.     Semaglutide-Weight Management (WEGOVY) 1.7 MG/0.75ML SOAJ Inject 1.7 mg into the skin once a week. 3 mL 3   tacrolimus (PROTOPIC) 0.1 % ointment Apply 1 Application topically 2 (two) times daily.     albuterol (VENTOLIN HFA) 108 (90 Base) MCG/ACT inhaler Inhale 1-2 puffs into the lungs every 6 (six) hours as needed for wheezing or shortness of breath. (Patient not taking: Reported on 11/05/2023) 8 g 1   amLODipine (NORVASC) 10 MG tablet Take 10 mg by mouth daily. (Patient not taking: Reported on 11/05/2023)     cetirizine (ZYRTEC) 10 MG tablet Take 10 mg by mouth daily. (Patient not taking: Reported on 11/05/2023)     predniSONE (STERAPRED UNI-PAK 21 TAB) 10 MG (21) TBPK tablet As directed (Patient not taking: Reported on 11/05/2023) 21 tablet 0   triamterene-hydrochlorothiazide (DYAZIDE) 37.5-25 MG per capsule Take 1 capsule by mouth daily. (Patient not taking: Reported on 11/05/2023)     No current facility-administered medications for this visit.    Known medication allergies: No Known Allergies   Physical examination: Blood pressure (!) 122/92, pulse 86, temperature 98.1 F (36.7 C), temperature source Temporal, resp. rate 16, height 5' 4.5" (1.638 m), weight 172 lb 12.8 oz (78.4 kg), SpO2 96%.  General: Alert, interactive, in  no acute distress. HEENT: PERRLA, TMs pearly gray, turbinates mildly edematous without discharge, post-pharynx non erythematous. Neck: Supple without lymphadenopathy. Lungs: Clear to auscultation without wheezing, rhonchi or rales. {no increased work of breathing. CV: Normal S1, S2 without murmurs. Abdomen: Nondistended, nontender. Skin: Hyperpigmented with slight scale to the upper and lower eyelids bilaterally . Extremities:  No clubbing, cyanosis or edema. Neuro:   Grossly intact.  Diagnositics/Labs: None today  Assessment and plan:   Allergic Rhinitis with conjunctivitis Year-round symptoms with ocular and nasal involvement. Current regimen of montelukast, Flonase, and Xyzal up to 4 times daily not providing adequate relief. History of allergy shots without significant improvement. -Continue montelukast 10mg  daily at bedtime -Replace Xyzal with prescription antihistamine Ryvent 6mg  1 tab twice a day. -Add Ryaltris nasal spray 2 sprays each nostril twice a day for congestion or drainage.  This is a combination spray with mometasone  for congestion control and olopatadine for drainage control.   With using nasal sprays point tip of bottle toward eye on same side nostril and lean head slightly forward for best technique.   Vella Redhead 1 drop each eye twice a day as needed for itchy/watery eyes -Update allergy testing on 11/12/2023 at 8:30 am.  Hold antihistamine for 3 days for this testing.  Ok to use prednisone if needed for symptom control.  -Continue neti pot use before nasal spray application.  Food Allergies New onset reactions to peanuts and crab legs. -Prescribe EpiPen 0.3mg  for emergency use. -Follow emergency action plan -Include food allergens in updated allergy testing.  Eczema, eyelid Severe itching of the skin, including around the eyes. Previous use of Protopic. -Trial Opzelura for eyelid twice a day as needed for itchy/dry/patchy/irritated skin.  This is a non-steroid  ointment that can be used anywhere on the body.   Reactive Airway due to allergens History of wheezing during high pollen seasons, use of albuterol inhaler. -Have access to albuterol inhaler 2 puffs every 4-6 hours as needed for cough/wheeze/shortness of breath/chest tightness.  May use 15-20 minutes prior to activity.   Monitor frequency of use.     Follow-up 1 week for testing to 1-55 environmental allergens, shellfish panel, nut panel  I appreciate the opportunity to take part in [place] care. Please do not hesitate to contact me with questions.  Sincerely,   Margo Aye, MD Allergy/Immunology [facility] ----------------------------------------------- Addendum to HPI 11/06/23: She has tried Optivar, Olopatadine and Ketotifen eye drops in the past with minimal relief of ocular symptoms.   Margo Aye, MD Allergy/Immunology [facility]

## 2023-11-05 NOTE — Patient Instructions (Addendum)
Allergic Rhinitis Year-round symptoms with ocular and nasal involvement. Current regimen of montelukast, Flonase, and Xyzal up to 4 times daily not providing adequate relief. History of allergy shots without significant improvement. -Continue montelukast 10mg  daily at bedtime -Replace Xyzal with prescription antihistamine Ryvent 6mg  1 tab twice a day. -Add Ryaltris nasal spray 2 sprays each nostril twice a day for congestion or drainage.  This is a combination spray with mometasone for congestion control and olopatadine for drainage control.   With using nasal sprays point tip of bottle toward eye on same side nostril and lean head slightly forward for best technique.   Vella Redhead 1 drop each eye twice a day as needed for itchy/watery eyes -Update allergy testing on 11/12/2023 at 8:30 am.  Hold antihistamine for 3 days for this testing.  Ok to use prednisone if needed for symptom control.  -Continue neti pot use before nasal spray application.  Food Allergies New onset reactions to peanuts and crab legs. -Prescribe EpiPen 0.3mg  for emergency use. -Follow emergency action plan -Include food allergens in updated allergy testing.  Eczema, eyelid Severe itching of the skin, including around the eyes. Previous use of Protopic. -Trial Opzelura for eyelid twice a day as needed for itchy/dry/patchy/irritated skin.  This is a non-steroid ointment that can be used anywhere on the body.   Reactive Airway due to allergens History of wheezing during high pollen seasons, use of albuterol inhaler. -Have access to albuterol inhaler 2 puffs every 4-6 hours as needed for cough/wheeze/shortness of breath/chest tightness.  May use 15-20 minutes prior to activity.   Monitor frequency of use.     Follow-up 1 week for testing to 1-55 environmental allergens, shellfish panel, nut panel

## 2023-11-06 ENCOUNTER — Telehealth: Payer: Self-pay

## 2023-11-06 ENCOUNTER — Other Ambulatory Visit (HOSPITAL_COMMUNITY): Payer: Self-pay

## 2023-11-06 NOTE — Telephone Encounter (Signed)
Please see message below and advise.

## 2023-11-06 NOTE — Telephone Encounter (Signed)
*  Asthma/Allergy  Pharmacy Patient Advocate Encounter   Received notification from CoverMyMeds that prior authorization for Bepotastine Besilate 1.5% solution  is required/requested.   Insurance verification completed.   The patient is insured through [facility] .   Per test claim:  OTC Ketotifen, OTC Olopatadine, Azelastine Opth-$10.00, Epinastine Opth-$50.00 is preferred by the insurance.  If suggested medication is appropriate, Please send in a new RX and discontinue this one. If not, please advise as to why it's not appropriate so that we may request a Prior Authorization. Please note, some preferred medications may still require a PA   CMM Key: [id number]

## 2023-11-10 ENCOUNTER — Telehealth: Payer: Self-pay

## 2023-11-10 ENCOUNTER — Other Ambulatory Visit (HOSPITAL_COMMUNITY): Payer: Self-pay

## 2023-11-10 MED ORDER — CARBINOXAMINE MALEATE 4 MG PO TABS: ORAL_TABLET | ORAL | 1 refills | Status: DC

## 2023-11-10 NOTE — Telephone Encounter (Signed)
Per Provider:  That's fine.  4mg  tabs.    Take 8mg  (2 tab) twice a day   Sent in new prescription: Carbinoxamine Maleate 4 mg  to pharmacy.  Called patient - DOB/DPR verified - LMOVM regarding the above notation.

## 2023-11-10 NOTE — Telephone Encounter (Signed)
Pharmacy Patient Advocate Encounter   Received notification from CoverMyMeds that prior authorization for RyVent 6MG  tablets is required/requested.   Insurance verification completed.   The patient is insured through [facility] .   Per test claim:  Carbinoxamine 4MG  tablets is preferred by the insurance.  If suggested medication is appropriate, Please send in a new RX and discontinue this one. If not, please advise as to why it's not appropriate so that we may request a Prior Authorization. Please note, some preferred medications may still require a PA   Carbinoxamine 4MG  tablets, one and one-half tablet (6 mg total) twice daily #90 for 30 days is $10.00

## 2023-11-10 NOTE — Telephone Encounter (Signed)
Insurance prefers 4mg  verus 6mg  is it okay to change?>

## 2023-11-12 ENCOUNTER — Other Ambulatory Visit: Payer: Self-pay

## 2023-11-12 ENCOUNTER — Telehealth: Payer: Self-pay | Admitting: Allergy

## 2023-11-12 ENCOUNTER — Ambulatory Visit: Payer: 59 | Admitting: Allergy

## 2023-11-12 ENCOUNTER — Encounter: Payer: Self-pay | Admitting: Allergy

## 2023-11-12 ENCOUNTER — Telehealth: Payer: Self-pay

## 2023-11-12 DIAGNOSIS — J3089 Other allergic rhinitis: Secondary | ICD-10-CM | POA: Diagnosis not present

## 2023-11-12 DIAGNOSIS — H1013 Acute atopic conjunctivitis, bilateral: Secondary | ICD-10-CM

## 2023-11-12 DIAGNOSIS — T7800XD Anaphylactic reaction due to unspecified food, subsequent encounter: Secondary | ICD-10-CM | POA: Diagnosis not present

## 2023-11-12 MED ORDER — EPINASTINE HCL 0.05 % OP SOLN: 1.0000 [drp] | Freq: Two times a day (BID) | OPHTHALMIC | 1 refills | Status: AC

## 2023-11-12 MED ORDER — TACROLIMUS 0.1 % EX OINT: TOPICAL_OINTMENT | Freq: Two times a day (BID) | CUTANEOUS | 0 refills | Status: AC

## 2023-11-12 MED ORDER — PREDNISONE 20 MG PO TABS: ORAL_TABLET | ORAL | 0 refills | Status: DC

## 2023-11-12 MED ORDER — FAMOTIDINE 20 MG PO TABS: ORAL_TABLET | ORAL | 0 refills | Status: AC

## 2023-11-12 MED ORDER — EPINEPHRINE 0.3 MG/0.3ML IJ SOAJ: 0.3000 mg | INTRAMUSCULAR | 1 refills | Status: AC | PRN

## 2023-11-12 NOTE — Telephone Encounter (Signed)
Sent in protopic for pt to [facility]

## 2023-11-12 NOTE — Telephone Encounter (Signed)
Pt would like to try protoic as she had it in past. Jeanette Brown LAWNDALE

## 2023-11-12 NOTE — Telephone Encounter (Signed)
Patient called stating her insurance will cover Rush. Patient states the max is 6 hours but if her provider suggests 8 hours then the insurance will cover 8 hours. Patient is looking for dates within this year. Patient states she is flexible and is open to going to any office.

## 2023-11-12 NOTE — Telephone Encounter (Signed)
Protopic was sent in by another nurse. Patient has been notified via voicemail.

## 2023-11-12 NOTE — Telephone Encounter (Signed)
Patient stated the Opzelura that she uses on her eye lids daily are causing her eyelids to become raw. Patient wanted to know what other treatment plan you recommend. I did advise patient to try to put the Opzelura in the fridge before applying to the eyelid. Patient verbalized understanding and was notified that she'd get a call later today with any other suggestions from Dr. Delorse Lek.

## 2023-11-12 NOTE — Addendum Note (Signed)
Addended by: Berna Bue on: 11/12/2023 11:33 AM   Modules accepted: Orders

## 2023-11-12 NOTE — Telephone Encounter (Signed)
Pre-meds and instructions sent to pt. Pt is aware.

## 2023-11-12 NOTE — Progress Notes (Signed)
Follow-up Note  RE: Jeanette Brown MRN: [id number] DOB: [birth year] Date of Office Visit: 11/12/2023   History of present illness: Jeanette Brown is a 47 y.o. female presenting today for skin testing.  She was last seen in the office on 11/05/23 by myself for allergic conjunctivitis and rhinitis.  She has held antihistamines for at least the past 2 days for testing today.  She is in her usual state of health today.   Medication List: Current Outpatient Medications  Medication Sig Dispense Refill   albuterol (VENTOLIN HFA) 108 (90 Base) MCG/ACT inhaler Inhale 1-2 puffs into the lungs every 6 (six) hours as needed for wheezing or shortness of breath. 18 g 1   amLODipine (NORVASC) 10 MG tablet Take 10 mg by mouth daily. (Patient not taking: Reported on 11/05/2023)     amphetamine-dextroamphetamine (ADDERALL XR) 30 MG 24 hr capsule Take 1 capsule (30 mg total) by mouth daily. 30 capsule 0   atenolol (TENORMIN) 50 MG tablet Take 10 mg by mouth daily.     Bepotastine Besilate 1.5 % SOLN Place 1 drop into both eyes 2 (two) times daily as needed (itchy/watery eyes). 10 mL 5   Carbinoxamine Maleate 4 MG TABS Take 2 tablets (8 mg total) by mouth 2 (TWO) Times a day. 60 tablet 1   cetirizine (ZYRTEC) 10 MG tablet Take 10 mg by mouth daily. (Patient not taking: Reported on 11/05/2023)     cimetidine (TAGAMET) 300 MG tablet Take 300 mg by mouth daily.     cyanocobalamin (VITAMIN B12) 500 MCG tablet Take 500 mcg by mouth daily.     Docusate Sodium (DSS) 100 MG CAPS Take 100 mg by mouth daily.     EPINEPHrine (EPIPEN 2-PAK) 0.3 mg/0.3 mL IJ SOAJ injection Inject 0.3 mg into the muscle as needed for anaphylaxis. 1 each 1   fluticasone (FLONASE) 50 MCG/ACT nasal spray Place 2 sprays into both nostrils daily.     levocetirizine (XYZAL) 5 MG tablet Take 5 mg by mouth every evening.     losartan-hydrochlorothiazide (HYZAAR) 100-25 MG tablet Take 1 tablet by mouth daily.     montelukast  (SINGULAIR) 10 MG tablet Take 1 tablet (10 mg total) by mouth at bedtime. 30 tablet 5   Olopatadine-Mometasone (RYALTRIS) 665-25 MCG/ACT SUSP Place 2 sprays into both nostrils in the morning and at bedtime. 29 g 5   predniSONE (STERAPRED UNI-PAK 21 TAB) 10 MG (21) TBPK tablet As directed (Patient not taking: Reported on 11/05/2023) 21 tablet 0   Semaglutide-Weight Management (WEGOVY) 1.7 MG/0.75ML SOAJ Inject 1.7 mg into the skin once a week. 3 mL 3   tacrolimus (PROTOPIC) 0.1 % ointment Apply 1 Application topically 2 (two) times daily.     triamterene-hydrochlorothiazide (DYAZIDE) 37.5-25 MG per capsule Take 1 capsule by mouth daily. (Patient not taking: Reported on 11/05/2023)     No current facility-administered medications for this visit.     Known medication allergies: No Known Allergies Diagnositics/Labs:  Allergy testing:   Airborne Adult Perc - 11/12/23 0800     Time Antigen Placed 5284    Allergen Manufacturer Waynette Buttery    Location Back    Number of Test 55    1. Control-Buffer 50% Glycerol Negative    2. Control-Histamine 3+    3. Bahia 4+    4. [place] 4+    5. Johnson 4+    6. [place] Blue 4+    7. Meadow Fescue 4+  8. Perennial Rye 4+    9. Timothy Negative    10. Ragweed Mix 2+    11. Cocklebur 3+    12. Plantain,  English 3+    13. Baccharis 2+    14. Dog Fennel 3+    15. Russian Thistle 3+    16. Lamb's Quarters 3+    17. Sheep Sorrell 3+    18. Rough Pigweed 3+    19. Marsh Elder, Rough 2+    20. Mugwort, Common 3+    21. Box, Elder 4+    22. Cedar, red 4+    23. Sweet Gum 4+    24. Pecan Pollen 4+    25. Pine Mix Negative    26. Walnut, Black Pollen 4+    27. Red Mulberry 4+    28. Ash Mix 4+    29. Birch Mix 3+    30. Beech American 2+    31. Cottonwood, Eastern 3+    32. Hickory, White 3+    33. Maple Mix 2+    34. Oak, [place] Mix 3+    35. Sycamore Eastern 4+    36. Alternaria Alternata Negative    37. Cladosporium Herbarum Negative    38.  Aspergillus Mix Negative    39. Penicillium Mix Negative    40. Bipolaris Sorokiniana (Helminthosporium) Negative    41. Drechslera Spicifera (Curvularia) Negative    42. Mucor Plumbeus Negative    43. Fusarium Moniliforme Negative    44. Aureobasidium Pullulans (pullulara) Negative    45. Rhizopus Oryzae Negative    46. Botrytis Cinera Negative    47. Epicoccum Nigrum Negative    48. Phoma Betae Negative    49. Dust Mite Mix 4+    50. Cat Hair 10,000 BAU/ml Negative    51.  Dog Epithelia Negative    52. Mixed Feathers Negative    53. Horse Epithelia Negative    54. Cockroach, German Negative    55. Tobacco Leaf 2+             Food Adult Perc - 11/12/23 0800     Time Antigen Placed 9147    Allergen Manufacturer Waynette Buttery    Location Back    Number of allergen test 14    1. Peanut 4+    8. Shellfish Mix Negative    10. Cashew Negative    11. Walnut Food Negative    12. Almond Negative    13. Hazelnut Negative    14. Pecan Food Negative    15. Pistachio Negative    16. [place] Nut Negative    17. Coconut Negative    23. Shrimp Negative    24. Crab Negative    25. Lobster Negative    26. Oyster Negative    27. Scallops 2+             Allergy testing results were read and interpreted by provider, documented by clinical staff.   Assessment and plan:   Allergic Rhinitis with conjunctivitis - Testing today showed: grasses, ragweed, weeds, trees, dust mites, and tobacco leaf - Copy of test results provided.  - Avoidance measures provided. - Montelukast 10mg  daily at bedtime.  This is antileukotriene. - Carbinoxamine 8mg  2 tabs twice a day.  This is antihistamine.  - Ryaltris nasal spray 2 sprays each nostril twice a day for congestion or drainage.  This is a combination spray with mometasone for congestion control and olopatadine for drainage control.   With using nasal  sprays point tip of bottle toward eye on same side nostril and lean head slightly forward for best  technique.   - Bepreve 1 drop each eye twice a day as needed for itchy/watery eyes - Continue neti pot use before nasal spray application. - Consider allergy shots as a means of long-term control. - Allergy shots "re-train" and "reset" the immune system to ignore environmental allergens and decrease the resulting immune response to those allergens (sneezing, itchy watery eyes, runny nose, nasal congestion, etc).    - Allergy shots improve symptoms in 75-85% of patients.  - We discussed RUSH protocol vs traditional start options for allergen injections.  Lamar Benes is a way to get to maintenance fasting where he would be in office for a full day and receive an injection every 40 to 45 minutes and essentially get through have the buildup in his day.  You would need to take a premedication regimen the day before and day off to help reduce your risk of reactions.  The premedication regimen includes your antihistamine, montelukast, Pepcid and prednisone.  Food Allergies - Skin testing is very positive to peanut - Recommend avoidance for peanut and crab -You tolerate eating tree nuts thus continue tree nuts in your diet and ensure there is no cross-contamination with peanut.  You tolerate shrimp and lobster in the diet does continue this as you tolerate.  -Have access to EpiPen 0.3mg  for emergency use in case of allergic reaction. -Follow emergency action plan  -------------------------------------- Remaining assessment and plan from initial visit:  Eczema, eyelid - Severe itching of the skin, including around the eyes. Previous use of Protopic. - Opzelura for eyelid twice a day as needed for itchy/dry/patchy/irritated skin.  This is a non-steroid ointment that can be used anywhere on the body.   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 - Have access to albuterol inhaler 2 puffs every 4-6 hours as needed for cough/wheeze/shortness of  breath/chest tightness.  May use 15-20 minutes prior to activity.   Monitor frequency of use.     Follow-up 3-4 months or sooner if needed  I appreciate the opportunity to take part in [place] care. Please do not hesitate to contact me with questions.  Sincerely,   Margo Aye, MD Allergy/Immunology [facility]

## 2023-11-12 NOTE — Patient Instructions (Signed)
Allergic Rhinitis with conjunctivitis - Testing today showed: grasses, ragweed, weeds, trees, dust mites, and tobacco leaf - Copy of test results provided.  - Avoidance measures provided. - Montelukast 10mg  daily at bedtime.  This is antileukotriene. - Carbinoxamine 8mg  2 tabs twice a day.  This is antihistamine.  - Ryaltris nasal spray 2 sprays each nostril twice a day for congestion or drainage.  This is a combination spray with mometasone for congestion control and olopatadine for drainage control.   With using nasal sprays point tip of bottle toward eye on same side nostril and lean head slightly forward for best technique.   - Bepreve 1 drop each eye twice a day as needed for itchy/watery eyes - Continue neti pot use before nasal spray application. - Consider allergy shots as a means of long-term control. - Allergy shots "re-train" and "reset" the immune system to ignore environmental allergens and decrease the resulting immune response to those allergens (sneezing, itchy watery eyes, runny nose, nasal congestion, etc).    - Allergy shots improve symptoms in 75-85% of patients.  - We discussed RUSH protocol vs traditional start options for allergen injections.  Lamar Benes is a way to get to maintenance fasting where he would be in office for a full day and receive an injection every 40 to 45 minutes and essentially get through have the buildup in his day.  You would need to take a premedication regimen the day before and day off to help reduce your risk of reactions.  The premedication regimen includes your antihistamine, montelukast, Pepcid and prednisone.  Food Allergies - Skin testing is very positive to peanut - Recommend avoidance for peanut and crab -You tolerate eating tree nuts thus continue tree nuts in your diet and ensure there is no cross-contamination with peanut.  You tolerate shrimp and lobster in the diet does continue this as you tolerate.  -Have access to EpiPen 0.3mg  for  emergency use in case of allergic reaction. -Follow emergency action plan  Eczema, eyelid - Severe itching of the skin, including around the eyes. Previous use of Protopic. - Opzelura for eyelid twice a day as needed for itchy/dry/patchy/irritated skin.  This is a non-steroid ointment that can be used anywhere on the body.   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 - Have access to albuterol inhaler 2 puffs every 4-6 hours as needed for cough/wheeze/shortness of breath/chest tightness.  May use 15-20 minutes prior to activity.   Monitor frequency of use.     Follow-up 3-4 months or sooner if needed

## 2023-11-13 ENCOUNTER — Telehealth: Payer: Self-pay | Admitting: Allergy

## 2023-11-13 ENCOUNTER — Encounter: Payer: Self-pay | Admitting: Allergy

## 2023-11-13 NOTE — Telephone Encounter (Signed)
Patient called she has questions about the allergy testing results asking for a call back from the nurse

## 2023-11-13 NOTE — Addendum Note (Signed)
Addended by: Lorrin Mais on: 11/13/2023 08:59 AM   Modules accepted: Orders

## 2023-11-13 NOTE — Telephone Encounter (Signed)
Patient sent MyChart message as well for Dr. Delorse Lek in regards to allergy testing. The message has been forwarded directly to Dr. Delorse Lek for her to respond.

## 2023-11-16 DIAGNOSIS — J3089 Other allergic rhinitis: Secondary | ICD-10-CM | POA: Diagnosis not present

## 2023-11-16 NOTE — Progress Notes (Signed)
Aeroallergen Immunotherapy  Ordering Provider: Dr. Margo Aye  Patient Details Name: Jeanette Brown MRN: [id number] Date of Birth: 12-09-1975  Order 1 of 1  Vial Label: pollen, mite  0.3 ml (Volume)  BAU Concentration -- 7 Grass Mix* 100,000 ([address] [place], [place], [place], [place], RedTop, Sweet Vernal, Timothy) 0.2 ml (Volume)  1:20 Concentration -- [place] 0.3 ml (Volume)  BAU Concentration -- [place] 10,000 0.2 ml (Volume)  1:20 Concentration -- [place] 0.3 ml (Volume)  1:20 Concentration -- Ragweed Mix 0.2 ml (Volume)  1:20 Concentration -- Cocklebur 0.2 ml (Volume)  1:10 Concentration -- Plantain English 0.2 ml (Volume)  1:20 Concentration -- [place]ern[place] Thistle 0.2 ml (Volume)  1:40 Concentration -- Baccharis 0.2 ml (Volume)  1:80 Concentration -- Dogfennel 0.5 ml (Volume)  1:20 Concentration -- Weed Mix* 0.5 ml (Volume)  1:20 Concentration -- Eastern 10 Tree Mix (also Sweet Gum) 0.2 ml (Volume)  1:10 Concentration -- Cedar, red 0.2 ml (Volume)  1:10 Concentration -- Pecan Pollen 0.2 ml (Volume)  1:20 Concentration -- Red Mulberry 0.2 ml (Volume)  1:20 Concentration -- Walnut, Black Pollen 0.5 ml (Volume)   AU Concentration -- Mite Mix (DF 5,000 & DP 5,000)   4.6  ml Extract Subtotal 0.4  ml Diluent 5.0  ml Maintenance Total  Schedule:  RUSH then B Silver Vial (1:1,000,000): Schedule B (6 doses) Blue Vial (1:100,000): Schedule B (6 doses) Yellow Vial (1:10,000): Schedule B (6 doses) Green Vial (1:1,000): Schedule B (6 doses) Red Vial (1:100): Schedule A (14 doses)  Special Instructions: RUSH in [place].    [place] home [place]. After completion of the first Red Vial, please space to every two weeks. After completion of the second Red Vial, please space to every 4 weeks. Ok to up dose new vials at 0.61mL --> 0.3 mL --> 0.5 mL.  Ok to come twice weekly, if desired, as long as there is 48 hours between injections.

## 2023-11-16 NOTE — Progress Notes (Signed)
VIALS EXP 11-15-24

## 2023-11-17 ENCOUNTER — Other Ambulatory Visit: Payer: Self-pay

## 2023-11-20 ENCOUNTER — Encounter: Payer: Self-pay | Admitting: Internal Medicine

## 2023-11-20 ENCOUNTER — Other Ambulatory Visit: Payer: Self-pay

## 2023-11-20 ENCOUNTER — Ambulatory Visit: Payer: 59 | Admitting: Internal Medicine

## 2023-11-20 VITALS — BP 122/70 | HR 89 | Temp 97.9°F | Resp 20 | Wt 169.5 lb

## 2023-11-20 DIAGNOSIS — J302 Other seasonal allergic rhinitis: Secondary | ICD-10-CM

## 2023-11-20 DIAGNOSIS — J3089 Other allergic rhinitis: Secondary | ICD-10-CM | POA: Diagnosis not present

## 2023-11-20 NOTE — Progress Notes (Signed)
RAPID DESENSITIZATION Note  RE: Jeanette Brown MRN: [id number] DOB: 1976-05-11 Date of Office Visit: 11/20/2023  Subjective:  Patient presents today for rapid desensitization.  Interval History: Patient has not been ill, she has taken all premedications as per protocol.  Recent/Current History: Pulmonary disease: no Cardiac disease: no Respiratory infection: no Rash: no Itch: no Swelling: no Cough: no Shortness of breath: no Runny/stuffy nose: no Itchy eyes: no Beta-blocker use: no  Patient/guardian was informed of the procedure with verbalized understanding of the risk of anaphylaxis. Consent has been signed.   Medication List:  Current Outpatient Medications  Medication Sig Dispense Refill   albuterol (VENTOLIN HFA) 108 (90 Base) MCG/ACT inhaler Inhale 1-2 puffs into the lungs every 6 (six) hours as needed for wheezing or shortness of breath. 18 g 1   amLODipine (NORVASC) 10 MG tablet Take 10 mg by mouth daily. (Patient not taking: Reported on 11/05/2023)     amphetamine-dextroamphetamine (ADDERALL XR) 30 MG 24 hr capsule Take 1 capsule (30 mg total) by mouth daily. 30 capsule 0   atenolol (TENORMIN) 50 MG tablet Take 10 mg by mouth daily.     Bepotastine Besilate 1.5 % SOLN Place 1 drop into both eyes 2 (two) times daily as needed (itchy/watery eyes). 10 mL 5   Carbinoxamine Maleate 4 MG TABS Take 2 tablets (8 mg total) by mouth 2 (TWO) Times a day. 60 tablet 1   cetirizine (ZYRTEC) 10 MG tablet Take 10 mg by mouth daily. (Patient not taking: Reported on 11/05/2023)     cimetidine (TAGAMET) 300 MG tablet Take 300 mg by mouth daily.     cyanocobalamin (VITAMIN B12) 500 MCG tablet Take 500 mcg by mouth daily.     Docusate Sodium (DSS) 100 MG CAPS Take 100 mg by mouth daily.     Epinastine HCl 0.05 % ophthalmic solution Place 1 drop into both eyes 2 (two) times daily. 15 mL 1   EPINEPHrine (EPIPEN 2-PAK) 0.3 mg/0.3 mL IJ SOAJ injection Inject 0.3 mg into the muscle  as needed for anaphylaxis. 1 each 1   famotidine (PEPCID) 20 MG tablet Take 1 tab twice daily Thursday and Friday before RUSH appt. 4 tablet 0   fluticasone (FLONASE) 50 MCG/ACT nasal spray Place 2 sprays into both nostrils daily.     levocetirizine (XYZAL) 5 MG tablet Take 5 mg by mouth every evening.     losartan-hydrochlorothiazide (HYZAAR) 100-25 MG tablet Take 1 tablet by mouth daily.     montelukast (SINGULAIR) 10 MG tablet Take 1 tablet (10 mg total) by mouth at bedtime. 30 tablet 5   Olopatadine-Mometasone (RYALTRIS) 665-25 MCG/ACT SUSP Place 2 sprays into both nostrils in the morning and at bedtime. 29 g 5   predniSONE (DELTASONE) 20 MG tablet Take 2 tabs Thursday and Friday morning of RUSH appt. 4 tablet 0   predniSONE (STERAPRED UNI-PAK 21 TAB) 10 MG (21) TBPK tablet As directed (Patient not taking: Reported on 11/05/2023) 21 tablet 0   Semaglutide-Weight Management (WEGOVY) 1.7 MG/0.75ML SOAJ Inject 1.7 mg into the skin once a week. 3 mL 3   tacrolimus (PROTOPIC) 0.1 % ointment Apply 1 Application topically 2 (two) times daily.     tacrolimus (PROTOPIC) 0.1 % ointment Apply topically 2 (two) times daily. 100 g 0   triamterene-hydrochlorothiazide (DYAZIDE) 37.5-25 MG per capsule Take 1 capsule by mouth daily. (Patient not taking: Reported on 11/05/2023)     No current facility-administered medications for this visit.   Allergies:  No Known Allergies I reviewed her past medical history, social history, family history, and environmental history and no significant changes have been reported from her previous visit.  ROS: Negative except as per HPI.  Objective: BP 122/70   Pulse 89   Temp 97.9 F (36.6 C) (Temporal)   Resp 20   Wt 169 lb 8 oz (76.9 kg)   SpO2 98%   BMI 28.65 kg/m  Body mass index is 28.65 kg/m.   General Appearance:  Alert, cooperative, no distress, appears stated age  Head:  Normocephalic, without obvious abnormality, atraumatic  Eyes:  Conjunctiva clear,  EOM's intact  Nose: Nares normal  Throat: Lips, tongue normal; teeth and gums normal, normal posterior oropharnyx  Neck: Supple, symmetrical  Lungs:   CTAB, Respirations unlabored, no coughing  Heart:  RRR, no murmur, Appears well perfused  Extremities: No edema  Skin: Skin color, texture, turgor normal, no rashes or lesions on visualized portions of skin  Neurologic: No gross deficits     Diagnostics:  PROCEDURES:  Patient received the following doses every hour: Step 1:  0.8ml - 1:1,000,000 dilution (silver vial) Step 2:  0.46ml - 1:1,000,000 dilution (silver vial) Step 3: 0.42ml - 1:100,000 dilution (blue vial)  Step 4: 0.16ml - 1:100,000 dilution (blue vial)  Step 5: 0.59ml - 1:10,000 dilution (gold vial) Step 6: 0.17ml - 1:10,000 dilution (gold vial) Step 7: 0.27ml - 1:10,000 dilution (gold vial) Step 8: 0.61ml - 1:10,000 dilution (gold vial)  Patient was observed for 1 hour after the last dose.   Procedure started at 8:47 AM Procedure ended at 3:43 PM   ASSESSMENT/PLAN:   Patient has tolerated the rapid desensitization protocol.  Next appointment: Start at 0.22ml of 1:1000 dilution (green vial) and build up per protocol. Going to [place] for rest of build-up.

## 2023-11-30 ENCOUNTER — Ambulatory Visit (INDEPENDENT_AMBULATORY_CARE_PROVIDER_SITE_OTHER): Payer: 59 | Admitting: *Deleted

## 2023-11-30 DIAGNOSIS — J309 Allergic rhinitis, unspecified: Secondary | ICD-10-CM

## 2023-12-08 ENCOUNTER — Ambulatory Visit (INDEPENDENT_AMBULATORY_CARE_PROVIDER_SITE_OTHER): Payer: 59 | Admitting: *Deleted

## 2023-12-08 DIAGNOSIS — J309 Allergic rhinitis, unspecified: Secondary | ICD-10-CM | POA: Diagnosis not present

## 2023-12-15 ENCOUNTER — Ambulatory Visit (INDEPENDENT_AMBULATORY_CARE_PROVIDER_SITE_OTHER): Payer: 59 | Admitting: *Deleted

## 2023-12-15 DIAGNOSIS — J309 Allergic rhinitis, unspecified: Secondary | ICD-10-CM

## 2023-12-22 ENCOUNTER — Ambulatory Visit (INDEPENDENT_AMBULATORY_CARE_PROVIDER_SITE_OTHER): Payer: 59 | Admitting: *Deleted

## 2023-12-22 DIAGNOSIS — J309 Allergic rhinitis, unspecified: Secondary | ICD-10-CM

## 2023-12-31 ENCOUNTER — Ambulatory Visit (INDEPENDENT_AMBULATORY_CARE_PROVIDER_SITE_OTHER): Payer: 59 | Admitting: *Deleted

## 2023-12-31 DIAGNOSIS — J309 Allergic rhinitis, unspecified: Secondary | ICD-10-CM | POA: Diagnosis not present

## 2024-01-11 ENCOUNTER — Ambulatory Visit (INDEPENDENT_AMBULATORY_CARE_PROVIDER_SITE_OTHER): Payer: 59 | Admitting: *Deleted

## 2024-01-11 DIAGNOSIS — J309 Allergic rhinitis, unspecified: Secondary | ICD-10-CM

## 2024-01-19 ENCOUNTER — Ambulatory Visit (INDEPENDENT_AMBULATORY_CARE_PROVIDER_SITE_OTHER): Payer: 59 | Admitting: *Deleted

## 2024-01-19 ENCOUNTER — Telehealth: Payer: Self-pay | Admitting: *Deleted

## 2024-01-19 DIAGNOSIS — J309 Allergic rhinitis, unspecified: Secondary | ICD-10-CM | POA: Diagnosis not present

## 2024-01-19 NOTE — Telephone Encounter (Signed)
Patient wants to transfer her allergy vials to her place of work at the [place] [employer]. A form has been filled out for the patient to take to have them sign and complete their portion to bring back to [place] in order for [place] to release her allergy vials.

## 2024-01-26 ENCOUNTER — Ambulatory Visit (INDEPENDENT_AMBULATORY_CARE_PROVIDER_SITE_OTHER): Payer: 59 | Admitting: *Deleted

## 2024-01-26 DIAGNOSIS — J309 Allergic rhinitis, unspecified: Secondary | ICD-10-CM | POA: Diagnosis not present

## 2024-02-02 ENCOUNTER — Ambulatory Visit (INDEPENDENT_AMBULATORY_CARE_PROVIDER_SITE_OTHER)

## 2024-02-02 DIAGNOSIS — J309 Allergic rhinitis, unspecified: Secondary | ICD-10-CM

## 2024-02-08 ENCOUNTER — Ambulatory Visit (INDEPENDENT_AMBULATORY_CARE_PROVIDER_SITE_OTHER)

## 2024-02-08 DIAGNOSIS — J309 Allergic rhinitis, unspecified: Secondary | ICD-10-CM

## 2024-02-17 ENCOUNTER — Ambulatory Visit (INDEPENDENT_AMBULATORY_CARE_PROVIDER_SITE_OTHER): Admitting: *Deleted

## 2024-02-17 DIAGNOSIS — J309 Allergic rhinitis, unspecified: Secondary | ICD-10-CM | POA: Diagnosis not present

## 2024-02-24 ENCOUNTER — Other Ambulatory Visit: Payer: Self-pay | Admitting: Allergy

## 2024-02-24 ENCOUNTER — Ambulatory Visit (INDEPENDENT_AMBULATORY_CARE_PROVIDER_SITE_OTHER): Admitting: *Deleted

## 2024-02-24 ENCOUNTER — Other Ambulatory Visit (HOSPITAL_BASED_OUTPATIENT_CLINIC_OR_DEPARTMENT_OTHER): Payer: Self-pay

## 2024-02-24 DIAGNOSIS — J309 Allergic rhinitis, unspecified: Secondary | ICD-10-CM | POA: Diagnosis not present

## 2024-02-25 ENCOUNTER — Other Ambulatory Visit: Payer: Self-pay

## 2024-02-25 ENCOUNTER — Other Ambulatory Visit (HOSPITAL_BASED_OUTPATIENT_CLINIC_OR_DEPARTMENT_OTHER): Payer: Self-pay

## 2024-02-25 ENCOUNTER — Other Ambulatory Visit (HOSPITAL_COMMUNITY): Payer: Self-pay

## 2024-02-25 MED ORDER — OLMESARTAN-AMLODIPINE-HCTZ 40-5-25 MG PO TABS: 1.0000 | ORAL_TABLET | Freq: Every day | ORAL | 2 refills | Status: AC

## 2024-02-25 MED ORDER — PREDNISONE 20 MG PO TABS: 20.0000 mg | ORAL_TABLET | Freq: Two times a day (BID) | ORAL | 1 refills | Status: DC

## 2024-02-25 MED ORDER — SEMAGLUTIDE-WEIGHT MANAGEMENT 1.7 MG/0.75ML ~~LOC~~ SOAJ: 1.7000 mg | SUBCUTANEOUS | 3 refills | Status: AC

## 2024-02-25 MED ORDER — SEMAGLUTIDE-WEIGHT MANAGEMENT 2.4 MG/0.75ML ~~LOC~~ SOAJ
2.4000 mg | SUBCUTANEOUS | 3 refills | Status: DC
  Filled 2024-02-25: qty 3, 28d supply, fill #0
  Filled 2024-03-29: qty 3, 28d supply, fill #1

## 2024-02-25 MED ORDER — CIMETIDINE 300 MG PO TABS
300.0000 mg | ORAL_TABLET | Freq: Every day | ORAL | 3 refills | Status: DC
  Filled 2024-02-25: qty 30, 30d supply, fill #0

## 2024-02-25 MED ORDER — PANTOPRAZOLE SODIUM 40 MG PO TBEC: 40.0000 mg | DELAYED_RELEASE_TABLET | Freq: Two times a day (BID) | ORAL | 4 refills | Status: AC

## 2024-02-25 MED ORDER — ERGOCALCIFEROL 1.25 MG (50000 UT) PO CAPS
1.0000 | ORAL_CAPSULE | ORAL | 2 refills | Status: DC
  Filled 2024-02-25: qty 4, 28d supply, fill #0
  Filled 2024-05-06: qty 4, 28d supply, fill #1

## 2024-02-25 MED ORDER — BUSPIRONE HCL 15 MG PO TABS: ORAL_TABLET | ORAL | 2 refills | Status: AC

## 2024-02-25 MED ORDER — PREDNISONE 10 MG (21) PO TBPK: ORAL_TABLET | ORAL | 1 refills | Status: AC

## 2024-02-25 MED ORDER — TRIAMCINOLONE ACETONIDE 0.1 % EX OINT: 1.0000 | TOPICAL_OINTMENT | Freq: Two times a day (BID) | CUTANEOUS | 2 refills | Status: AC

## 2024-02-25 MED ORDER — ERGOCALCIFEROL 1.25 MG (50000 UT) PO CAPS
1.0000 | ORAL_CAPSULE | ORAL | 2 refills | Status: AC
  Filled 2024-03-29: qty 4, 28d supply, fill #0
  Filled 2024-06-01 – 2024-06-14 (×2): qty 4, 28d supply, fill #1
  Filled 2024-07-11: qty 4, 28d supply, fill #2

## 2024-02-25 MED FILL — Carbinoxamine Maleate Tab 4 MG: ORAL | 15 days supply | Qty: 60 | Fill #0 | Status: AC

## 2024-02-26 ENCOUNTER — Encounter (HOSPITAL_COMMUNITY): Payer: Self-pay

## 2024-02-26 ENCOUNTER — Other Ambulatory Visit (HOSPITAL_COMMUNITY): Payer: Self-pay

## 2024-02-26 ENCOUNTER — Other Ambulatory Visit (HOSPITAL_BASED_OUTPATIENT_CLINIC_OR_DEPARTMENT_OTHER): Payer: Self-pay

## 2024-03-02 ENCOUNTER — Ambulatory Visit (INDEPENDENT_AMBULATORY_CARE_PROVIDER_SITE_OTHER): Admitting: *Deleted

## 2024-03-02 DIAGNOSIS — J309 Allergic rhinitis, unspecified: Secondary | ICD-10-CM | POA: Diagnosis not present

## 2024-03-09 ENCOUNTER — Encounter: Payer: Self-pay | Admitting: Allergy

## 2024-03-09 ENCOUNTER — Other Ambulatory Visit: Payer: Self-pay

## 2024-03-09 ENCOUNTER — Other Ambulatory Visit (HOSPITAL_BASED_OUTPATIENT_CLINIC_OR_DEPARTMENT_OTHER): Payer: Self-pay

## 2024-03-09 ENCOUNTER — Ambulatory Visit (INDEPENDENT_AMBULATORY_CARE_PROVIDER_SITE_OTHER): Payer: 59 | Admitting: Allergy

## 2024-03-09 VITALS — BP 124/82 | HR 114 | Temp 98.6°F | Resp 16

## 2024-03-09 DIAGNOSIS — H01135 Eczematous dermatitis of left lower eyelid: Secondary | ICD-10-CM

## 2024-03-09 DIAGNOSIS — H1013 Acute atopic conjunctivitis, bilateral: Secondary | ICD-10-CM

## 2024-03-09 DIAGNOSIS — J4520 Mild intermittent asthma, uncomplicated: Secondary | ICD-10-CM

## 2024-03-09 DIAGNOSIS — J3089 Other allergic rhinitis: Secondary | ICD-10-CM

## 2024-03-09 DIAGNOSIS — H01131 Eczematous dermatitis of right upper eyelid: Secondary | ICD-10-CM | POA: Diagnosis not present

## 2024-03-09 DIAGNOSIS — H01132 Eczematous dermatitis of right lower eyelid: Secondary | ICD-10-CM

## 2024-03-09 DIAGNOSIS — H01134 Eczematous dermatitis of left upper eyelid: Secondary | ICD-10-CM

## 2024-03-09 DIAGNOSIS — J302 Other seasonal allergic rhinitis: Secondary | ICD-10-CM | POA: Diagnosis not present

## 2024-03-09 DIAGNOSIS — T7800XD Anaphylactic reaction due to unspecified food, subsequent encounter: Secondary | ICD-10-CM

## 2024-03-09 MED ORDER — METHYLPREDNISOLONE ACETATE 80 MG/ML IJ SUSP
80.0000 mg | Freq: Once | INTRAMUSCULAR | Status: AC
  Administered 2024-03-09: 80 mg via INTRAMUSCULAR

## 2024-03-09 MED ORDER — CARBINOXAMINE MALEATE 4 MG PO TABS
2.0000 | ORAL_TABLET | Freq: Two times a day (BID) | ORAL | 5 refills | Status: DC
  Filled 2024-03-09 – 2024-05-06 (×2): qty 60, 15d supply, fill #0
  Filled 2024-07-11: qty 60, 15d supply, fill #1
  Filled 2024-08-18: qty 60, 15d supply, fill #2
  Filled 2024-11-18: qty 60, 15d supply, fill #3

## 2024-03-09 MED ORDER — BEPOTASTINE BESILATE 1.5 % OP SOLN
1.0000 [drp] | Freq: Two times a day (BID) | OPHTHALMIC | 5 refills | Status: DC | PRN
Start: 2024-03-09 — End: 2024-03-09

## 2024-03-09 MED ORDER — RYALTRIS 665-25 MCG/ACT NA SUSP: 2.0000 | Freq: Two times a day (BID) | NASAL | 5 refills | Status: DC

## 2024-03-09 MED ORDER — ALBUTEROL SULFATE HFA 108 (90 BASE) MCG/ACT IN AERS: 1.0000 | INHALATION_SPRAY | Freq: Four times a day (QID) | RESPIRATORY_TRACT | 1 refills | Status: DC | PRN

## 2024-03-09 MED ORDER — VTAMA 1 % EX CREA: 1.0000 | TOPICAL_CREAM | Freq: Two times a day (BID) | CUTANEOUS | Status: DC | PRN

## 2024-03-09 MED ORDER — METHYLPREDNISOLONE ACETATE 80 MG/ML IJ SUSP: 80.0000 mg | Freq: Once | INTRAMUSCULAR | Status: DC

## 2024-03-09 MED ORDER — BEPOTASTINE BESILATE 1.5 % OP SOLN
1.0000 [drp] | Freq: Two times a day (BID) | OPHTHALMIC | 5 refills | Status: DC | PRN
  Filled 2024-03-09: qty 10, 30d supply, fill #0

## 2024-03-09 MED ORDER — CARBINOXAMINE MALEATE 4 MG PO TABS: 2.0000 | ORAL_TABLET | Freq: Two times a day (BID) | ORAL | 5 refills | Status: DC

## 2024-03-09 MED ORDER — MONTELUKAST SODIUM 10 MG PO TABS: 10.0000 mg | ORAL_TABLET | Freq: Every day | ORAL | 5 refills | Status: DC

## 2024-03-09 MED ORDER — ALBUTEROL SULFATE HFA 108 (90 BASE) MCG/ACT IN AERS
1.0000 | INHALATION_SPRAY | Freq: Four times a day (QID) | RESPIRATORY_TRACT | 1 refills | Status: AC | PRN
  Filled 2024-03-09: qty 18, 17d supply, fill #0

## 2024-03-09 MED ORDER — RYALTRIS 665-25 MCG/ACT NA SUSP
2.0000 | Freq: Two times a day (BID) | NASAL | 5 refills | Status: DC
  Filled 2024-03-09: qty 29, 30d supply, fill #0

## 2024-03-09 MED ORDER — MONTELUKAST SODIUM 10 MG PO TABS
10.0000 mg | ORAL_TABLET | Freq: Every day | ORAL | 5 refills | Status: DC
  Filled 2024-03-09: qty 30, 30d supply, fill #0

## 2024-03-09 NOTE — Progress Notes (Signed)
 Follow-up Note  RE: Jeanette Brown MRN: [id number] DOB: 04/17/1976 Date of Office Visit: 03/09/2024   History of present illness: Jeanette Brown is a 48 y.o. female presenting today for follow-up/allergy flare.  She has history of allergic rhinitis with conjunctivitis, eyelid eczema as well as history of food allergy and reactive airway due to allergens.  She was last seen in the office on 11/11/2020 when she underwent RUSH desensitization to start allergy shots. Discussed the use of AI scribe software for clinical note transcription with the patient, who gave verbal consent to proceed.  She is undergoing allergy immunotherapy and is currently at 0.77mL of the red vial, nearing the end of the buildup phase. She started the rush immunotherapy at the end of December. Her allergy symptoms, which typically flare up at the end of January, began later this year, about two and a half to three weeks ago so she does believe she has noted some differences in starting on allergy shots. She experiences significant allergy symptoms currently, particularly affecting her eyes, ears, and describes the pollen as 'so bad'. She takes carbinoxamine, two tablets twice a day, which she feels works better than over-the-counter options. Despite this, she experiences severe congestion, especially after rain, and has had to use Benadryl occasionally to manage symptoms. She reports difficulty with nasal congestion, stating that 'nothing's moving' and that congestion is stuck in her ear and throat.  She has not been able to perform nasal saline flushes as she cannot get anything to move.  She has a history of using various eye drops, including Pataday, Zaditor, Alaway, Optivar, but finds them minimally effective.  Currently using epinastine eyedrops.  She recalls a allergy and steroid eye drop from years ago that was helpful but has not had this prescribed since then.  She does see an eye doctor however  she states he is only recommended Pataday.  Her eyes are sensitive to light, and she experiences significant itching and irritation.  The skin around her eyes are raw and she is using the Protopic in the morning and layers it with Vaseline at night to manage eyelid eczema. This regimen helps to some extent.     Review of systems: 10pt ROS negative unless noted above in HPI  Past medical/social/surgical/family history have been reviewed and are unchanged unless specifically indicated below.  No changes  Medication List: Current Outpatient Medications  Medication Sig Dispense Refill   albuterol (VENTOLIN HFA) 108 (90 Base) MCG/ACT inhaler Inhale 1-2 puffs into the lungs every 6 (six) hours as needed for wheezing or shortness of breath. 18 g 1   amLODipine (NORVASC) 10 MG tablet Take 10 mg by mouth daily.     amphetamine-dextroamphetamine (ADDERALL XR) 30 MG 24 hr capsule Take 1 capsule (30 mg total) by mouth daily. 30 capsule 0   atenolol (TENORMIN) 50 MG tablet Take 10 mg by mouth daily.     Carbinoxamine Maleate 4 MG TABS TAKE 2 TABLETS BY MOUTH TWICE A DAY 60 tablet 2   cimetidine (TAGAMET) 300 MG tablet Take 300 mg by mouth daily.     cimetidine (TAGAMET) 300 MG tablet Take 1 tablet (300 mg total) by mouth daily. 30 tablet 3   cyanocobalamin (VITAMIN B12) 500 MCG tablet Take 500 mcg by mouth daily.     Docusate Sodium (DSS) 100 MG CAPS Take 100 mg by mouth daily.     Epinastine HCl 0.05 % ophthalmic solution Place 1 drop into both eyes  2 (two) times daily. 15 mL 1   EPINEPHrine (EPIPEN 2-PAK) 0.3 mg/0.3 mL IJ SOAJ injection Inject 0.3 mg into the muscle as needed for anaphylaxis. 1 each 1   ergocalciferol (VITAMIN D2) 1.25 MG (50000 UT) capsule Take 1 capsule (50,000 Units total) by mouth once a week. 4 capsule 2   ergocalciferol (VITAMIN D2) 1.25 MG (50000 UT) capsule Take 1 capsule (50,000 Units total) by mouth once a week. 4 capsule 2   famotidine (PEPCID) 20 MG tablet Take 1 tab twice  daily Thursday and Friday before RUSH appt. 4 tablet 0   losartan-hydrochlorothiazide (HYZAAR) 100-25 MG tablet Take 1 tablet by mouth daily.     montelukast (SINGULAIR) 10 MG tablet Take 1 tablet (10 mg total) by mouth at bedtime. 30 tablet 5   Olmesartan-amLODIPine-HCTZ 40-5-25 MG TABS Take 1 tablet by mouth daily. 30 tablet 2   Olopatadine-Mometasone (RYALTRIS) 665-25 MCG/ACT SUSP Place 2 sprays into both nostrils in the morning and at bedtime. 29 g 5   pantoprazole (PROTONIX) 40 MG tablet Take 1 tablet (40 mg total) by mouth 2 (two) times daily. 180 tablet 4   predniSONE (STERAPRED UNI-PAK 21 TAB) 10 MG (21) TBPK tablet As directed 21 tablet 0   predniSONE (STERAPRED UNI-PAK 21 TAB) 10 MG (21) TBPK tablet Take 6 tablets by mouth on day 1, 5 tabs on day 2, 4 tabs on day 3, 3 tabs on day 4, 2 tabs on day 5, 1 tab on day 6. Then stop. 21 tablet 1   Semaglutide-Weight Management 1.7 MG/0.75ML SOAJ Inject 1.7 mg into the skin once a week. 3 mL 3   Semaglutide-Weight Management 2.4 MG/0.75ML SOAJ Inject 2.4 mg into the skin once a week. 3 mL 3   tacrolimus (PROTOPIC) 0.1 % ointment Apply 1 Application topically 2 (two) times daily.     tacrolimus (PROTOPIC) 0.1 % ointment Apply topically 2 (two) times daily. 100 g 0   triamcinolone ointment (KENALOG) 0.1 % Apply to the affected external areas topically two times daily as directed. 80 g 2   Bepotastine Besilate 1.5 % SOLN Place 1 drop into both eyes 2 (two) times daily as needed (itchy/watery eyes). (Patient not taking: Reported on 03/09/2024) 10 mL 5   cetirizine (ZYRTEC) 10 MG tablet Take 10 mg by mouth daily. (Patient not taking: Reported on 03/09/2024)     fluticasone (FLONASE) 50 MCG/ACT nasal spray Place 2 sprays into both nostrils daily. (Patient not taking: Reported on 03/09/2024)     levocetirizine (XYZAL) 5 MG tablet Take 5 mg by mouth every evening. (Patient not taking: Reported on 03/09/2024)     predniSONE (DELTASONE) 20 MG tablet Take 2 tabs  Thursday and Friday morning of RUSH appt. (Patient not taking: Reported on 03/09/2024) 4 tablet 0   predniSONE (DELTASONE) 20 MG tablet Take 1 tablet (20 mg total) by mouth 2 (two) times daily for 3 days. (Patient not taking: Reported on 03/09/2024) 6 tablet 1   triamterene-hydrochlorothiazide (DYAZIDE) 37.5-25 MG per capsule Take 1 capsule by mouth daily. (Patient not taking: Reported on 03/09/2024)     No current facility-administered medications for this visit.     Known medication allergies: No Known Allergies   Physical examination: Blood pressure 124/82, pulse (!) 114, temperature 98.6 F (37 C), temperature source Temporal, resp. rate 16, SpO2 98%.  General: Alert, interactive, in no acute distress. HEENT: Eyes tearing bilaterally PERRLA, TMs pearly gray, turbinates markedly edematous without discharge, post-pharynx non erythematous. Neck: Supple without  lymphadenopathy. Lungs: Clear to auscultation without wheezing, rhonchi or rales. {no increased work of breathing. CV: Normal S1, S2 without murmurs. Abdomen: Nondistended, nontender. Skin: Periorbital hyperpigmentation of the lower lid with mild scaling of the upper lid, Warm and dry, without lesions or rashes. Extremities:  No clubbing, cyanosis or edema. Neuro:   Grossly intact.  Diagnositics/Labs: Depo-Medrol 80 mg injection given in office today Allergen immunotherapy injection given as well  Assessment and plan: Allergic Rhinitis with conjunctivitis - Continue avoidance measures for grasses, ragweed, weeds, trees, dust mites, and tobacco leaf - Montelukast 10mg  daily at bedtime.  This is antileukotriene. - Carbinoxamine 8mg  2 tabs twice a day.  This is antihistamine.  - Ryaltris nasal spray 2 sprays each nostril twice a day for congestion or drainage.  This is a combination spray with mometasone for congestion control and olopatadine for drainage control.   With using nasal sprays point tip of bottle toward eye on same side  nostril and lean head slightly forward for best technique.   - Will try to get Bepreve 1 drop each eye twice a day approved at this time as you have tried the OTC eye drops as well as Optivar and Epinastine with minimal response.   - Continue neti pot use before nasal spray application if able. - Continue allergy shots per schedule.  You are very close to reaching maintenance dosing! - Steroid injection given today in office to help with allergy flare - Can look into Respiray personal air filter device  Food Allergies - Skin testing was very positive to peanut - Continue avoidance for peanut and crab -You tolerate eating tree nuts thus continue tree nuts in your diet and ensure there is no cross-contamination with peanut.  You tolerate shrimp and lobster in the diet does continue this as you tolerate.  -Have access to EpiPen 0.3mg  for emergency use in case of allergic reaction. -Follow emergency action plan  Eczema, eyelid - Severe itching of the skin, including around the eyes.  - Currently using Protopic ointment twice a day.     Provided with samples of Vtama twice a day as needed application today which is a new non-steroid ointment to see if this works better than Protopic.  If so let me know and can send in a prescription.   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 - Have access to albuterol inhaler 2 puffs every 4-6 hours as needed for cough/wheeze/shortness of breath/chest tightness.  May use 15-20 minutes prior to activity.   Monitor frequency of use.     Follow-up 6 months or sooner if needed  I appreciate the opportunity to take part in [place] care. Please do not hesitate to contact me with questions.  Sincerely,   Margo Aye, MD Allergy/Immunology [facility]

## 2024-03-09 NOTE — Patient Instructions (Signed)
 Allergic Rhinitis with conjunctivitis - Continue avoidance measures for grasses, ragweed, weeds, trees, dust mites, and tobacco leaf - Montelukast 10mg  daily at bedtime.  This is antileukotriene. - Carbinoxamine 8mg  2 tabs twice a day.  This is antihistamine.  - Ryaltris nasal spray 2 sprays each nostril twice a day for congestion or drainage.  This is a combination spray with mometasone for congestion control and olopatadine for drainage control.   With using nasal sprays point tip of bottle toward eye on same side nostril and lean head slightly forward for best technique.   - Will try to get Bepreve 1 drop each eye twice a day approved at this time as you have tried the OTC eye drops as well as Optivar and Epinastine with minimal response.   - Continue neti pot use before nasal spray application if able. - Continue allergy shots per schedule.  You are very close to reaching maintenance dosing! - Steroid injection given today in office to help with allergy flare - Can look into Respiray personal air filter device  Food Allergies - Skin testing was very positive to peanut - Continue avoidance for peanut and crab -You tolerate eating tree nuts thus continue tree nuts in your diet and ensure there is no cross-contamination with peanut.  You tolerate shrimp and lobster in the diet does continue this as you tolerate.  -Have access to EpiPen 0.3mg  for emergency use in case of allergic reaction. -Follow emergency action plan  Eczema, eyelid - Severe itching of the skin, including around the eyes.  - Currently using Protopic ointment twice a day.     Provided with samples of Vtama twice a day as needed application today which is a new non-steroid ointment to see if this works better than Protopic.  If so let me know and can send in a prescription.   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 -  Have access to albuterol inhaler 2 puffs every 4-6 hours as needed for cough/wheeze/shortness of breath/chest tightness.  May use 15-20 minutes prior to activity.   Monitor frequency of use.     Follow-up 6 months or sooner if needed   Medication Samples have been provided to the patient.  Drug name: Verlon Setting: 1  LOT: [id number]  Exp.Date: 05/2024  Dosing instructions: 1 application twice daily as needed  The patient has been instructed regarding the correct time, dose, and frequency of taking this medication, including desired effects and most common side effects.   Genia Harold 3:31 PM 03/09/2024

## 2024-03-17 ENCOUNTER — Ambulatory Visit (INDEPENDENT_AMBULATORY_CARE_PROVIDER_SITE_OTHER)

## 2024-03-17 DIAGNOSIS — J309 Allergic rhinitis, unspecified: Secondary | ICD-10-CM

## 2024-03-25 ENCOUNTER — Ambulatory Visit (INDEPENDENT_AMBULATORY_CARE_PROVIDER_SITE_OTHER): Payer: Self-pay

## 2024-03-25 DIAGNOSIS — J309 Allergic rhinitis, unspecified: Secondary | ICD-10-CM | POA: Diagnosis not present

## 2024-03-29 ENCOUNTER — Other Ambulatory Visit (HOSPITAL_BASED_OUTPATIENT_CLINIC_OR_DEPARTMENT_OTHER): Payer: Self-pay

## 2024-03-30 ENCOUNTER — Other Ambulatory Visit: Payer: Self-pay

## 2024-03-30 ENCOUNTER — Other Ambulatory Visit (HOSPITAL_BASED_OUTPATIENT_CLINIC_OR_DEPARTMENT_OTHER): Payer: Self-pay

## 2024-03-31 ENCOUNTER — Other Ambulatory Visit (HOSPITAL_COMMUNITY): Payer: Self-pay

## 2024-03-31 ENCOUNTER — Other Ambulatory Visit (HOSPITAL_BASED_OUTPATIENT_CLINIC_OR_DEPARTMENT_OTHER): Payer: Self-pay

## 2024-03-31 MED ORDER — AMPHETAMINE-DEXTROAMPHET ER 30 MG PO CP24
30.0000 mg | ORAL_CAPSULE | Freq: Every day | ORAL | 0 refills | Status: DC
  Filled 2024-03-31 (×3): qty 30, 30d supply, fill #0

## 2024-04-01 ENCOUNTER — Ambulatory Visit (INDEPENDENT_AMBULATORY_CARE_PROVIDER_SITE_OTHER)

## 2024-04-01 DIAGNOSIS — J309 Allergic rhinitis, unspecified: Secondary | ICD-10-CM | POA: Diagnosis not present

## 2024-04-06 ENCOUNTER — Other Ambulatory Visit: Payer: Self-pay

## 2024-04-06 ENCOUNTER — Other Ambulatory Visit (HOSPITAL_BASED_OUTPATIENT_CLINIC_OR_DEPARTMENT_OTHER): Payer: Self-pay

## 2024-04-06 MED ORDER — BUTALBITAL-APAP-CAFFEINE 50-325-40 MG PO CAPS
1.0000 | ORAL_CAPSULE | ORAL | 0 refills | Status: AC | PRN
  Filled 2024-04-06: qty 30, 5d supply, fill #0

## 2024-04-06 MED ORDER — DRYSOL 20 % EX SOLN
CUTANEOUS | 3 refills | Status: AC
Start: 2024-04-06 — End: ?
  Filled 2024-04-06: qty 60, 30d supply, fill #0

## 2024-04-07 ENCOUNTER — Other Ambulatory Visit: Payer: Self-pay

## 2024-04-07 ENCOUNTER — Ambulatory Visit (INDEPENDENT_AMBULATORY_CARE_PROVIDER_SITE_OTHER): Payer: Self-pay

## 2024-04-07 DIAGNOSIS — J309 Allergic rhinitis, unspecified: Secondary | ICD-10-CM

## 2024-04-13 DIAGNOSIS — J3089 Other allergic rhinitis: Secondary | ICD-10-CM | POA: Diagnosis not present

## 2024-04-13 NOTE — Progress Notes (Signed)
 VIAL MADE 04-13-24

## 2024-04-22 ENCOUNTER — Ambulatory Visit (INDEPENDENT_AMBULATORY_CARE_PROVIDER_SITE_OTHER)

## 2024-04-22 DIAGNOSIS — J309 Allergic rhinitis, unspecified: Secondary | ICD-10-CM | POA: Diagnosis not present

## 2024-05-05 ENCOUNTER — Ambulatory Visit (INDEPENDENT_AMBULATORY_CARE_PROVIDER_SITE_OTHER)

## 2024-05-05 DIAGNOSIS — J309 Allergic rhinitis, unspecified: Secondary | ICD-10-CM

## 2024-05-06 ENCOUNTER — Other Ambulatory Visit (HOSPITAL_BASED_OUTPATIENT_CLINIC_OR_DEPARTMENT_OTHER): Payer: Self-pay

## 2024-05-06 ENCOUNTER — Other Ambulatory Visit: Payer: Self-pay

## 2024-05-06 MED ORDER — AMPHETAMINE-DEXTROAMPHET ER 30 MG PO CP24
30.0000 mg | ORAL_CAPSULE | Freq: Every day | ORAL | 0 refills | Status: AC
  Filled 2024-05-06: qty 30, 30d supply, fill #0

## 2024-05-06 MED ORDER — CIMETIDINE 300 MG PO TABS
300.0000 mg | ORAL_TABLET | Freq: Every day | ORAL | 3 refills | Status: DC
  Filled 2024-05-06: qty 30, 30d supply, fill #0
  Filled 2024-09-20: qty 30, 30d supply, fill #1
  Filled 2024-10-18: qty 30, 30d supply, fill #2
  Filled 2024-11-18: qty 30, 30d supply, fill #3

## 2024-05-06 MED ORDER — SEMAGLUTIDE-WEIGHT MANAGEMENT 2.4 MG/0.75ML ~~LOC~~ SOAJ
2.4000 mg | SUBCUTANEOUS | 3 refills | Status: DC
  Filled 2024-05-06: qty 3, 28d supply, fill #0
  Filled 2024-06-01: qty 3, 28d supply, fill #1
  Filled 2024-06-04: qty 3, 28d supply, fill #2
  Filled 2024-06-14: qty 3, 28d supply, fill #1
  Filled 2024-07-11: qty 3, 28d supply, fill #2
  Filled 2024-08-18: qty 3, 28d supply, fill #3

## 2024-05-23 ENCOUNTER — Ambulatory Visit (INDEPENDENT_AMBULATORY_CARE_PROVIDER_SITE_OTHER)

## 2024-05-23 DIAGNOSIS — J309 Allergic rhinitis, unspecified: Secondary | ICD-10-CM | POA: Diagnosis not present

## 2024-06-01 ENCOUNTER — Other Ambulatory Visit (HOSPITAL_BASED_OUTPATIENT_CLINIC_OR_DEPARTMENT_OTHER): Payer: Self-pay

## 2024-06-02 ENCOUNTER — Other Ambulatory Visit (HOSPITAL_BASED_OUTPATIENT_CLINIC_OR_DEPARTMENT_OTHER): Payer: Self-pay

## 2024-06-02 ENCOUNTER — Other Ambulatory Visit: Payer: Self-pay

## 2024-06-02 MED ORDER — AMPHETAMINE-DEXTROAMPHET ER 30 MG PO CP24
30.0000 mg | ORAL_CAPSULE | Freq: Every day | ORAL | 0 refills | Status: DC
  Filled 2024-06-02 – 2024-06-14 (×2): qty 30, 30d supply, fill #0

## 2024-06-04 ENCOUNTER — Other Ambulatory Visit (HOSPITAL_BASED_OUTPATIENT_CLINIC_OR_DEPARTMENT_OTHER): Payer: Self-pay

## 2024-06-06 ENCOUNTER — Other Ambulatory Visit (HOSPITAL_BASED_OUTPATIENT_CLINIC_OR_DEPARTMENT_OTHER): Payer: Self-pay

## 2024-06-07 ENCOUNTER — Other Ambulatory Visit (HOSPITAL_BASED_OUTPATIENT_CLINIC_OR_DEPARTMENT_OTHER): Payer: Self-pay

## 2024-06-13 ENCOUNTER — Other Ambulatory Visit (HOSPITAL_BASED_OUTPATIENT_CLINIC_OR_DEPARTMENT_OTHER): Payer: Self-pay

## 2024-06-14 ENCOUNTER — Ambulatory Visit (INDEPENDENT_AMBULATORY_CARE_PROVIDER_SITE_OTHER)

## 2024-06-14 ENCOUNTER — Other Ambulatory Visit (HOSPITAL_BASED_OUTPATIENT_CLINIC_OR_DEPARTMENT_OTHER): Payer: Self-pay

## 2024-06-14 DIAGNOSIS — J309 Allergic rhinitis, unspecified: Secondary | ICD-10-CM

## 2024-06-22 ENCOUNTER — Ambulatory Visit (INDEPENDENT_AMBULATORY_CARE_PROVIDER_SITE_OTHER)

## 2024-06-22 DIAGNOSIS — J309 Allergic rhinitis, unspecified: Secondary | ICD-10-CM

## 2024-06-29 ENCOUNTER — Ambulatory Visit (INDEPENDENT_AMBULATORY_CARE_PROVIDER_SITE_OTHER)

## 2024-06-29 DIAGNOSIS — J309 Allergic rhinitis, unspecified: Secondary | ICD-10-CM | POA: Diagnosis not present

## 2024-07-11 ENCOUNTER — Other Ambulatory Visit: Payer: Self-pay

## 2024-07-11 ENCOUNTER — Other Ambulatory Visit (HOSPITAL_BASED_OUTPATIENT_CLINIC_OR_DEPARTMENT_OTHER): Payer: Self-pay

## 2024-07-11 MED ORDER — AMPHETAMINE-DEXTROAMPHET ER 30 MG PO CP24
30.0000 mg | ORAL_CAPSULE | Freq: Every day | ORAL | 0 refills | Status: DC
  Filled 2024-07-11 – 2024-07-13 (×2): qty 30, 30d supply, fill #0

## 2024-07-12 ENCOUNTER — Other Ambulatory Visit: Payer: Self-pay

## 2024-07-12 ENCOUNTER — Other Ambulatory Visit (HOSPITAL_BASED_OUTPATIENT_CLINIC_OR_DEPARTMENT_OTHER): Payer: Self-pay

## 2024-07-12 MED ORDER — ATENOLOL 100 MG PO TABS
100.0000 mg | ORAL_TABLET | Freq: Every day | ORAL | 3 refills | Status: AC
  Filled 2024-07-12: qty 30, 30d supply, fill #0
  Filled 2024-08-18: qty 30, 30d supply, fill #1
  Filled 2024-09-20: qty 30, 30d supply, fill #2
  Filled 2024-10-18: qty 30, 30d supply, fill #3
  Filled 2024-11-18: qty 30, 30d supply, fill #4
  Filled 2024-12-23: qty 30, 30d supply, fill #5

## 2024-07-12 MED ORDER — LOSARTAN POTASSIUM-HCTZ 100-25 MG PO TABS
1.0000 | ORAL_TABLET | Freq: Every day | ORAL | 0 refills | Status: DC
  Filled 2024-07-12: qty 30, 30d supply, fill #0

## 2024-07-12 MED ORDER — AMLODIPINE BESYLATE 5 MG PO TABS
5.0000 mg | ORAL_TABLET | Freq: Every day | ORAL | 0 refills | Status: DC
  Filled 2024-07-12: qty 30, 30d supply, fill #0

## 2024-07-13 ENCOUNTER — Other Ambulatory Visit: Payer: Self-pay

## 2024-07-13 ENCOUNTER — Other Ambulatory Visit (HOSPITAL_BASED_OUTPATIENT_CLINIC_OR_DEPARTMENT_OTHER): Payer: Self-pay

## 2024-07-22 ENCOUNTER — Ambulatory Visit

## 2024-07-22 DIAGNOSIS — J309 Allergic rhinitis, unspecified: Secondary | ICD-10-CM | POA: Diagnosis not present

## 2024-08-18 ENCOUNTER — Other Ambulatory Visit (HOSPITAL_BASED_OUTPATIENT_CLINIC_OR_DEPARTMENT_OTHER): Payer: Self-pay

## 2024-08-18 MED ORDER — AMPHETAMINE-DEXTROAMPHET ER 30 MG PO CP24
30.0000 mg | ORAL_CAPSULE | Freq: Every day | ORAL | 0 refills | Status: DC
  Filled 2024-08-18: qty 30, 30d supply, fill #0

## 2024-08-18 MED ORDER — LOSARTAN POTASSIUM-HCTZ 100-25 MG PO TABS
1.0000 | ORAL_TABLET | Freq: Every day | ORAL | 2 refills | Status: AC
  Filled 2024-08-18: qty 30, 30d supply, fill #0
  Filled 2024-09-20: qty 30, 30d supply, fill #1
  Filled 2024-10-18: qty 30, 30d supply, fill #2
  Filled 2024-11-18: qty 30, 30d supply, fill #3
  Filled 2024-12-23: qty 30, 30d supply, fill #4

## 2024-08-18 MED ORDER — AMLODIPINE BESYLATE 5 MG PO TABS
5.0000 mg | ORAL_TABLET | Freq: Every day | ORAL | 2 refills | Status: AC
  Filled 2024-08-18: qty 30, 30d supply, fill #0
  Filled 2024-09-20: qty 30, 30d supply, fill #1
  Filled 2024-10-18: qty 30, 30d supply, fill #2
  Filled 2024-11-18: qty 30, 30d supply, fill #3
  Filled 2024-12-23: qty 30, 30d supply, fill #4

## 2024-08-19 ENCOUNTER — Other Ambulatory Visit: Payer: Self-pay

## 2024-08-19 ENCOUNTER — Other Ambulatory Visit (HOSPITAL_BASED_OUTPATIENT_CLINIC_OR_DEPARTMENT_OTHER): Payer: Self-pay

## 2024-08-29 ENCOUNTER — Ambulatory Visit (INDEPENDENT_AMBULATORY_CARE_PROVIDER_SITE_OTHER)

## 2024-08-29 DIAGNOSIS — J309 Allergic rhinitis, unspecified: Secondary | ICD-10-CM

## 2024-09-08 ENCOUNTER — Ambulatory Visit: Admitting: Allergy

## 2024-09-20 ENCOUNTER — Other Ambulatory Visit (HOSPITAL_BASED_OUTPATIENT_CLINIC_OR_DEPARTMENT_OTHER): Payer: Self-pay

## 2024-09-21 ENCOUNTER — Other Ambulatory Visit (HOSPITAL_BASED_OUTPATIENT_CLINIC_OR_DEPARTMENT_OTHER): Payer: Self-pay

## 2024-09-21 ENCOUNTER — Other Ambulatory Visit: Payer: Self-pay

## 2024-09-21 MED ORDER — ERGOCALCIFEROL 1.25 MG (50000 UT) PO CAPS
1.0000 | ORAL_CAPSULE | ORAL | 2 refills | Status: DC
  Filled 2024-09-21: qty 4, 28d supply, fill #0
  Filled 2024-10-18: qty 4, 28d supply, fill #1
  Filled 2024-11-18: qty 4, 28d supply, fill #2

## 2024-09-21 MED ORDER — AMPHETAMINE-DEXTROAMPHET ER 30 MG PO CP24
30.0000 mg | ORAL_CAPSULE | Freq: Every day | ORAL | 0 refills | Status: DC
  Filled 2024-09-21: qty 30, 30d supply, fill #0

## 2024-09-21 MED ORDER — SEMAGLUTIDE-WEIGHT MANAGEMENT 2.4 MG/0.75ML ~~LOC~~ SOAJ
2.4000 mg | SUBCUTANEOUS | 3 refills | Status: AC
  Filled 2024-09-21: qty 3, 28d supply, fill #0
  Filled 2024-10-18: qty 3, 28d supply, fill #1
  Filled 2024-11-18: qty 3, 28d supply, fill #2

## 2024-09-26 ENCOUNTER — Ambulatory Visit (INDEPENDENT_AMBULATORY_CARE_PROVIDER_SITE_OTHER)

## 2024-09-26 DIAGNOSIS — J309 Allergic rhinitis, unspecified: Secondary | ICD-10-CM

## 2024-10-18 ENCOUNTER — Other Ambulatory Visit: Payer: Self-pay

## 2024-10-18 ENCOUNTER — Other Ambulatory Visit (HOSPITAL_BASED_OUTPATIENT_CLINIC_OR_DEPARTMENT_OTHER): Payer: Self-pay

## 2024-10-18 MED ORDER — AMPHETAMINE-DEXTROAMPHET ER 30 MG PO CP24
30.0000 mg | ORAL_CAPSULE | Freq: Every day | ORAL | 0 refills | Status: DC
  Filled 2024-10-19: qty 30, 30d supply, fill #0

## 2024-10-19 ENCOUNTER — Other Ambulatory Visit: Payer: Self-pay

## 2024-10-19 ENCOUNTER — Other Ambulatory Visit (HOSPITAL_BASED_OUTPATIENT_CLINIC_OR_DEPARTMENT_OTHER): Payer: Self-pay

## 2024-10-24 ENCOUNTER — Other Ambulatory Visit (HOSPITAL_BASED_OUTPATIENT_CLINIC_OR_DEPARTMENT_OTHER): Payer: Self-pay

## 2024-10-25 ENCOUNTER — Other Ambulatory Visit (HOSPITAL_BASED_OUTPATIENT_CLINIC_OR_DEPARTMENT_OTHER): Payer: Self-pay

## 2024-10-31 ENCOUNTER — Ambulatory Visit

## 2024-10-31 DIAGNOSIS — J309 Allergic rhinitis, unspecified: Secondary | ICD-10-CM

## 2024-11-15 ENCOUNTER — Other Ambulatory Visit (HOSPITAL_BASED_OUTPATIENT_CLINIC_OR_DEPARTMENT_OTHER): Payer: Self-pay

## 2024-11-18 ENCOUNTER — Other Ambulatory Visit (HOSPITAL_BASED_OUTPATIENT_CLINIC_OR_DEPARTMENT_OTHER): Payer: Self-pay

## 2024-11-18 ENCOUNTER — Other Ambulatory Visit: Payer: Self-pay

## 2024-11-18 MED ORDER — AMPHETAMINE-DEXTROAMPHET ER 30 MG PO CP24
30.0000 mg | ORAL_CAPSULE | Freq: Every day | ORAL | 0 refills | Status: DC
  Filled 2024-11-21: qty 30, 30d supply, fill #0

## 2024-11-19 ENCOUNTER — Other Ambulatory Visit (HOSPITAL_BASED_OUTPATIENT_CLINIC_OR_DEPARTMENT_OTHER): Payer: Self-pay

## 2024-11-21 ENCOUNTER — Other Ambulatory Visit (HOSPITAL_BASED_OUTPATIENT_CLINIC_OR_DEPARTMENT_OTHER): Payer: Self-pay

## 2024-11-21 ENCOUNTER — Other Ambulatory Visit: Payer: Self-pay

## 2024-12-08 ENCOUNTER — Ambulatory Visit: Admitting: Allergy

## 2024-12-08 ENCOUNTER — Other Ambulatory Visit: Payer: Self-pay

## 2024-12-08 ENCOUNTER — Other Ambulatory Visit (HOSPITAL_BASED_OUTPATIENT_CLINIC_OR_DEPARTMENT_OTHER): Payer: Self-pay

## 2024-12-08 ENCOUNTER — Ambulatory Visit

## 2024-12-08 ENCOUNTER — Encounter: Payer: Self-pay | Admitting: Allergy

## 2024-12-08 VITALS — BP 120/90 | HR 76 | Temp 98.4°F | Ht 64.5 in | Wt 176.9 lb

## 2024-12-08 DIAGNOSIS — H01135 Eczematous dermatitis of left lower eyelid: Secondary | ICD-10-CM

## 2024-12-08 DIAGNOSIS — J302 Other seasonal allergic rhinitis: Secondary | ICD-10-CM

## 2024-12-08 DIAGNOSIS — H01132 Eczematous dermatitis of right lower eyelid: Secondary | ICD-10-CM | POA: Diagnosis not present

## 2024-12-08 DIAGNOSIS — J4520 Mild intermittent asthma, uncomplicated: Secondary | ICD-10-CM

## 2024-12-08 DIAGNOSIS — H01131 Eczematous dermatitis of right upper eyelid: Secondary | ICD-10-CM

## 2024-12-08 DIAGNOSIS — J3089 Other allergic rhinitis: Secondary | ICD-10-CM

## 2024-12-08 DIAGNOSIS — H1013 Acute atopic conjunctivitis, bilateral: Secondary | ICD-10-CM

## 2024-12-08 DIAGNOSIS — H01134 Eczematous dermatitis of left upper eyelid: Secondary | ICD-10-CM

## 2024-12-08 MED ORDER — AIRSUPRA 90-80 MCG/ACT IN AERO
2.0000 | INHALATION_SPRAY | RESPIRATORY_TRACT | 1 refills | Status: AC | PRN
  Filled 2024-12-08: qty 10.7, 30d supply, fill #0

## 2024-12-08 MED ORDER — VTAMA 1 % EX CREA
1.0000 | TOPICAL_CREAM | Freq: Two times a day (BID) | CUTANEOUS | 3 refills | Status: AC | PRN
  Filled 2024-12-08: qty 60, 30d supply, fill #0

## 2024-12-08 MED ORDER — NEFFY 2 MG/0.1ML NA SOLN: 1.0000 | NASAL | 1 refills | Status: AC | PRN

## 2024-12-08 MED ORDER — MONTELUKAST SODIUM 10 MG PO TABS
10.0000 mg | ORAL_TABLET | Freq: Every day | ORAL | 5 refills | Status: AC
  Filled 2024-12-08: qty 30, 30d supply, fill #0

## 2024-12-08 MED ORDER — BEPOTASTINE BESILATE 1.5 % OP SOLN
1.0000 [drp] | Freq: Two times a day (BID) | OPHTHALMIC | 5 refills | Status: AC | PRN
  Filled 2024-12-08: qty 5, 25d supply, fill #0

## 2024-12-08 MED ORDER — RYALTRIS 665-25 MCG/ACT NA SUSP: 2.0000 | Freq: Two times a day (BID) | NASAL | 5 refills | Status: AC

## 2024-12-08 MED ORDER — CARBINOXAMINE MALEATE 4 MG PO TABS
2.0000 | ORAL_TABLET | Freq: Two times a day (BID) | ORAL | 5 refills | Status: AC
  Filled 2024-12-08: qty 60, 15d supply, fill #0

## 2024-12-08 NOTE — Patient Instructions (Addendum)
 Allergic Rhinitis with conjunctivitis - Continue avoidance measures for grasses, ragweed, weeds, trees, dust mites, and tobacco leaf - Montelukast  10mg  daily at bedtime.  This is antileukotriene. - Carbinoxamine  8mg  2 tabs twice a day.  This is antihistamine.  - Ryaltris  nasal spray 2 sprays each nostril twice a day for congestion or drainage.  This is a combination spray with mometasone  for congestion control and olopatadine  for drainage control.   With using nasal sprays point tip of bottle toward eye on same side nostril and lean head slightly forward for best technique.   - Optivar  or Bepreve  1 drop each eye twice a day as needed.  Will try again to get Bepreve  approved.  - Continue neti pot (saline rinse) use before nasal spray application if able. - Continue allergy  shots per schedule.  You are at maintenance dosing! Have access to your epinephrine  device.    Food Allergies - Skin testing was very positive to peanut - Continue avoidance for peanut and crab -You tolerate eating tree nuts thus continue tree nuts in your diet and ensure there is no cross-contamination with peanut.  You tolerate shrimp and lobster in the diet thus continue this as you tolerate.  -Have access to EpiPen  0.3mg  for emergency use in case of allergic reaction. -Follow emergency action plan  Eczema, eyelid - Severe itching of the skin, including around the eyes.  - Continue use of Vtama  twice a day as needed application. 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 - Have access to Airsupra  OR albuterol  inhaler 2 puffs every 4-6 hours as needed for cough/wheeze/shortness of breath/chest tightness.  May use 15-20 minutes prior to activity.   Monitor frequency of use.     Follow-up 6 months or sooner if needed

## 2024-12-08 NOTE — Progress Notes (Signed)
 "   Follow-up Note  RE: Jeanette Brown MRN: [id number] DOB: Apr 14, 1976 Date of Office Visit: 12/08/2024   History of present illness: Jeanette Brown is a 49 y.o. female presenting today for follow-up of allergic rhinitis with conjunctivitis, food allergy , eczema, reactive airway.  She was last seen in the office on 03/09/24 by myself.   Discussed the use of AI scribe software for clinical note transcription with the patient, who gave verbal consent to proceed.  She is on maintenance dosing for her allergy  shots, having reached the full strength of 0.5 ml since July. She receives her shots every four weeks and has been on this regimen for about six months.  She has access to epinephrine  device.    She continues to take her allergy  medications daily, including carboxamine twice a day and Singulair  (montelukast ) daily. She is cautious about reducing her medication due to past severe allergy  symptoms.  She has experienced itchy eyes in the past and was given a sample of Vtama , which has worked well. She uses it sparingly and still has some left.  She has had a cold since Christmas Eve, with symptoms including a persistent cough, especially at night, which has required the use of her rescue inhaler. She has been using nasal rinses and sprays to manage congestion, which has been clear.  No recent food reactions as she avoids known allergens including peanuts and crab.     Review of systems 10pt ROS negative unless noted above in HPI   Past medical/social/surgical/family history have been reviewed and are unchanged unless specifically indicated below.  No changes  Medication List: Current Outpatient Medications  Medication Sig Dispense Refill   albuterol  (VENTOLIN  HFA) 108 (90 Base) MCG/ACT inhaler Inhale 1-2 puffs into the lungs every 6 (six) hours as needed for wheezing or shortness of breath. 18 g 1   aluminum  chloride (DRYSOL) 20 % external solution Apply 1  application every day by topical route. 60 mL 3   amLODipine  (NORVASC ) 10 MG tablet Take 10 mg by mouth daily.     amphetamine -dextroamphetamine  (ADDERALL XR) 30 MG 24 hr capsule Take 1 capsule (30 mg total) by mouth daily. 30 capsule 0   amphetamine -dextroamphetamine  (ADDERALL XR) 30 MG 24 hr capsule Take 1 capsule (30 mg total) by mouth daily. 30 capsule 0   atenolol  (TENORMIN ) 100 MG tablet Take 1 tablet (100 mg total) by mouth daily. 90 tablet 3   Bepotastine  Besilate 1.5 % SOLN Place 1 drop into both eyes 2 (two) times daily as needed (itchy/watery eyes). 10 mL 5   Butalbital -APAP-Caffeine  50-325-40 MG capsule Take 1 capsule by mouth every 4 (four) hours as needed for headache. 30 capsule 0   Carbinoxamine  Maleate 4 MG TABS TAKE 2 TABLETS BY MOUTH TWICE A DAY 60 tablet 5   cimetidine  (TAGAMET ) 300 MG tablet Take 300 mg by mouth daily.     cyanocobalamin (VITAMIN B12) 500 MCG tablet Take 500 mcg by mouth daily.     Docusate Sodium  (DSS) 100 MG CAPS Take 100 mg by mouth daily.     EPINEPHrine  (EPIPEN  2-PAK) 0.3 mg/0.3 mL IJ SOAJ injection Inject 0.3 mg into the muscle as needed for anaphylaxis. 1 each 1   ergocalciferol  (VITAMIN D2) 1.25 MG (50000 UT) capsule Take 1 capsule (50,000 Units total) by mouth once a week. 4 capsule 2   famotidine  (PEPCID ) 20 MG tablet Take 1 tab twice daily Thursday and Friday before [facility] appt. 4 tablet 0  losartan -hydrochlorothiazide  (HYZAAR ) 100-25 MG tablet Take 1 tablet by mouth daily. 90 tablet 2   montelukast  (SINGULAIR ) 10 MG tablet Take 1 tablet (10 mg total) by mouth at bedtime. 30 tablet 5   Olmesartan -amLODIPine -HCTZ 40-5-25 MG TABS Take 1 tablet by mouth daily. 30 tablet 2   Olopatadine -Mometasone  (RYALTRIS ) 665-25 MCG/ACT SUSP Place 2 sprays into both nostrils in the morning and at bedtime. 29 g 5   pantoprazole  (PROTONIX ) 40 MG tablet Take 1 tablet (40 mg total) by mouth 2 (two) times daily. 180 tablet 4   predniSONE  (STERAPRED UNI-PAK 21 TAB) 10 MG  (21) TBPK tablet Take 6 tablets by mouth on day 1, 5 tabs on day 2, 4 tabs on day 3, 3 tabs on day 4, 2 tabs on day 5, 1 tab on day 6. Then stop. 21 tablet 1   semaglutide -weight management (WEGOVY ) 2.4 MG/0.75ML SOAJ SQ injection Inject 2.4 mg into the skin once a week. 3 mL 3   Semaglutide -Weight Management 1.7 MG/0.75ML SOAJ Inject 1.7 mg into the skin once a week. 3 mL 3   tacrolimus  (PROTOPIC ) 0.1 % ointment Apply 1 Application topically 2 (two) times daily.     tacrolimus  (PROTOPIC ) 0.1 % ointment Apply topically 2 (two) times daily. 100 g 0   Tapinarof  (VTAMA ) 1 % CREA Apply 1 Application topically 2 (two) times daily as needed.     amLODipine  (NORVASC ) 5 MG tablet Take 1 tablet (5 mg total) by mouth daily. (Patient not taking: Reported on 12/08/2024) 90 tablet 2   amLODIPine  Besy-Benazepril HCl (LOTREL PO) Take 5 mg by mouth daily. (Patient not taking: Reported on 12/08/2024)     atenolol  (TENORMIN ) 50 MG tablet Take 10 mg by mouth daily. (Patient not taking: Reported on 12/08/2024)     cimetidine  (TAGAMET ) 300 MG tablet Take 1 tablet (300 mg total) by mouth daily. (Patient not taking: Reported on 12/08/2024) 30 tablet 3   Epinastine HCl 0.05 % ophthalmic solution Place 1 drop into both eyes 2 (two) times daily. 15 mL 1   ergocalciferol  (VITAMIN D2) 1.25 MG (50000 UT) capsule Take 1 capsule (50,000 Units total) by mouth once a week. (Patient not taking: Reported on 12/08/2024) 4 capsule 2   losartan -hydrochlorothiazide  (HYZAAR ) 100-25 MG tablet Take 1 tablet by mouth daily.     triamcinolone  ointment (KENALOG ) 0.1 % Apply to the affected external areas topically two times daily as directed. (Patient not taking: Reported on 12/08/2024) 80 g 2   No current facility-administered medications for this visit.     Known medication allergies: Allergies[1]   Physical examination: Blood pressure (!) 120/90, pulse 76, temperature 98.4 F (36.9 C), temperature source Temporal, height 5' 4.5 (1.638 m),  weight 176 lb 14.4 oz (80.2 kg), SpO2 98%.  General: Alert, interactive, in no acute distress. HEENT: PERRLA, TMs pearly gray, turbinates minimally edematous without discharge, post-pharynx non erythematous. Neck: Supple without lymphadenopathy. Lungs: Clear to auscultation without wheezing, rhonchi or rales. {no increased work of breathing. CV: Normal S1, S2 without murmurs. Abdomen: Nondistended, nontender. Skin: Warm and dry, without lesions or rashes. Extremities:  No clubbing, cyanosis or edema. Neuro:   Grossly intact.  Diagnostics/Labs: None today  Assessment and plan:   Allergic Rhinitis with conjunctivitis - Continue avoidance measures for grasses, ragweed, weeds, trees, dust mites, and tobacco leaf - Montelukast  10mg  daily at bedtime.  This is antileukotriene. - Carbinoxamine  8mg  2 tabs twice a day.  This is antihistamine.  - Ryaltris  nasal spray 2 sprays each nostril  twice a day for congestion or drainage.  This is a combination spray with mometasone  for congestion control and olopatadine  for drainage control.   With using nasal sprays point tip of bottle toward eye on same side nostril and lean head slightly forward for best technique.   - Optivar  or Bepreve  1 drop each eye twice a day as needed.  Will try again to get Bepreve  approved.  - Continue neti pot (saline rinse) use before nasal spray application if able. - Continue allergy  shots per schedule.  You are at maintenance dosing! Have access to your epinephrine  device.    Food Allergies - Skin testing was very positive to peanut - Continue avoidance for peanut and crab -You tolerate eating tree nuts thus continue tree nuts in your diet and ensure there is no cross-contamination with peanut.  You tolerate shrimp and lobster in the diet thus continue this as you tolerate.  -Have access to EpiPen  0.3mg  for emergency use in case of allergic reaction. -Follow emergency action plan  Eczema, eyelid - Severe itching of the  skin, including around the eyes.  - Continue use of Vtama  twice a day as needed application. 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 - Have access to Airsupra  OR albuterol  inhaler 2 puffs every 4-6 hours as needed for cough/wheeze/shortness of breath/chest tightness.  May use 15-20 minutes prior to activity.   Monitor frequency of use.     Follow-up 6 months or sooner if needed  I appreciate the opportunity to take part in [facility] care. Please do not hesitate to contact me with questions.  Sincerely,   Danita Brain, MD Allergy /Immunology [facility]      [1] No Known Allergies  "

## 2024-12-13 ENCOUNTER — Other Ambulatory Visit (HOSPITAL_BASED_OUTPATIENT_CLINIC_OR_DEPARTMENT_OTHER): Payer: Self-pay

## 2024-12-13 MED ORDER — AZITHROMYCIN 250 MG PO TABS
ORAL_TABLET | ORAL | 1 refills | Status: AC
  Filled 2024-12-13: qty 13, 30d supply, fill #0

## 2024-12-23 ENCOUNTER — Other Ambulatory Visit (HOSPITAL_BASED_OUTPATIENT_CLINIC_OR_DEPARTMENT_OTHER): Payer: Self-pay

## 2024-12-23 ENCOUNTER — Other Ambulatory Visit: Payer: Self-pay

## 2024-12-23 MED ORDER — ERGOCALCIFEROL 1.25 MG (50000 UT) PO CAPS
1.0000 | ORAL_CAPSULE | ORAL | 2 refills | Status: AC
  Filled 2024-12-23: qty 4, 28d supply, fill #0

## 2024-12-23 MED ORDER — AMPHETAMINE-DEXTROAMPHET ER 30 MG PO CP24
30.0000 mg | ORAL_CAPSULE | Freq: Every day | ORAL | 0 refills | Status: AC
  Filled 2024-12-23: qty 30, 30d supply, fill #0

## 2024-12-23 MED ORDER — CIMETIDINE 300 MG PO TABS
300.0000 mg | ORAL_TABLET | Freq: Every day | ORAL | 3 refills | Status: AC
  Filled 2024-12-23: qty 30, 30d supply, fill #0

## 2024-12-24 ENCOUNTER — Other Ambulatory Visit (HOSPITAL_BASED_OUTPATIENT_CLINIC_OR_DEPARTMENT_OTHER): Payer: Self-pay

## 2024-12-27 NOTE — Progress Notes (Signed)
 VIAL MADE ON 12/27/24

## 2024-12-28 ENCOUNTER — Other Ambulatory Visit (HOSPITAL_BASED_OUTPATIENT_CLINIC_OR_DEPARTMENT_OTHER): Payer: Self-pay

## 2024-12-29 ENCOUNTER — Other Ambulatory Visit (HOSPITAL_BASED_OUTPATIENT_CLINIC_OR_DEPARTMENT_OTHER): Payer: Self-pay

## 2025-01-06 ENCOUNTER — Ambulatory Visit: Admitting: *Deleted

## 2025-01-06 DIAGNOSIS — J302 Other seasonal allergic rhinitis: Secondary | ICD-10-CM

## 2025-06-07 ENCOUNTER — Ambulatory Visit: Admitting: Allergy
